# Patient Record
Sex: Male | Born: 1960 | Race: White | Hispanic: No | Marital: Single | Smoking: Never smoker
Health system: Southern US, Community
[De-identification: ages and names within clinical notes are randomized; demographics above are authoritative.]

## PROBLEM LIST (undated history)

## (undated) DIAGNOSIS — F068 Other specified mental disorders due to known physiological condition: Secondary | ICD-10-CM

## (undated) DIAGNOSIS — I10 Essential (primary) hypertension: Secondary | ICD-10-CM

## (undated) DIAGNOSIS — Z9889 Other specified postprocedural states: Secondary | ICD-10-CM

## (undated) DIAGNOSIS — M720 Palmar fascial fibromatosis [Dupuytren]: Secondary | ICD-10-CM

## (undated) DIAGNOSIS — Z87828 Personal history of other (healed) physical injury and trauma: Secondary | ICD-10-CM

## (undated) DIAGNOSIS — G40909 Epilepsy, unspecified, not intractable, without status epilepticus: Secondary | ICD-10-CM

## (undated) DIAGNOSIS — E785 Hyperlipidemia, unspecified: Secondary | ICD-10-CM

## (undated) DIAGNOSIS — R569 Unspecified convulsions: Secondary | ICD-10-CM

## (undated) DIAGNOSIS — M2550 Pain in unspecified joint: Secondary | ICD-10-CM

## (undated) DIAGNOSIS — M170 Bilateral primary osteoarthritis of knee: Secondary | ICD-10-CM

## (undated) DIAGNOSIS — R251 Tremor, unspecified: Secondary | ICD-10-CM

## (undated) HISTORY — PX: RIB FRACTURE SURGERY: SHX2358

## (undated) HISTORY — DX: Unspecified convulsions: R56.9

## (undated) HISTORY — DX: Pain in unspecified joint: M25.50

## (undated) HISTORY — DX: Hyperlipidemia, unspecified: E78.5

## (undated) HISTORY — PX: BRAIN SURGERY: SHX531

## (undated) HISTORY — DX: Essential (primary) hypertension: I10

---

## 2014-11-26 ENCOUNTER — Ambulatory Visit (INDEPENDENT_AMBULATORY_CARE_PROVIDER_SITE_OTHER): Payer: Medicare PPO | Admitting: Family Medicine

## 2014-11-26 ENCOUNTER — Encounter: Payer: Self-pay | Admitting: Family Medicine

## 2014-11-26 VITALS — BP 118/80 | HR 64 | Ht 71.0 in | Wt 200.0 lb

## 2014-11-26 DIAGNOSIS — I10 Essential (primary) hypertension: Secondary | ICD-10-CM | POA: Diagnosis not present

## 2014-11-26 DIAGNOSIS — G40909 Epilepsy, unspecified, not intractable, without status epilepticus: Secondary | ICD-10-CM

## 2014-11-26 DIAGNOSIS — E785 Hyperlipidemia, unspecified: Secondary | ICD-10-CM | POA: Diagnosis not present

## 2014-11-26 DIAGNOSIS — M170 Bilateral primary osteoarthritis of knee: Secondary | ICD-10-CM | POA: Diagnosis not present

## 2014-11-26 LAB — POCT URINALYSIS DIPSTICK
BILIRUBIN UA: NEGATIVE
Blood, UA: NEGATIVE
Glucose, UA: NEGATIVE
KETONES UA: NEGATIVE
LEUKOCYTES UA: NEGATIVE
Nitrite, UA: NEGATIVE
Protein, UA: NEGATIVE
Spec Grav, UA: 1.02
Urobilinogen, UA: 0.2
pH, UA: 6

## 2014-11-26 MED ORDER — DIVALPROEX SODIUM 500 MG PO DR TAB: 500.0000 mg | DELAYED_RELEASE_TABLET | Freq: Three times a day (TID) | ORAL | Status: DC

## 2014-11-26 MED ORDER — ETODOLAC 500 MG PO TABS: 500.0000 mg | ORAL_TABLET | Freq: Two times a day (BID) | ORAL | Status: DC

## 2014-11-26 MED ORDER — LISINOPRIL-HYDROCHLOROTHIAZIDE 10-12.5 MG PO TABS: 1.0000 | ORAL_TABLET | Freq: Every day | ORAL | Status: DC

## 2014-11-26 MED ORDER — CARBAMAZEPINE 200 MG PO TABS: 400.0000 mg | ORAL_TABLET | Freq: Three times a day (TID) | ORAL | Status: DC

## 2014-11-26 MED ORDER — LOVASTATIN ER 40 MG PO TB24: 40.0000 mg | ORAL_TABLET | Freq: Every day | ORAL | Status: DC

## 2014-11-26 NOTE — Progress Notes (Signed)
Name: Jerome Gilmore   MRN: [id number]    DOB: 1961/03/09   Date:11/26/2014       Progress Note  Subjective  Chief Complaint  Chief Complaint  Patient presents with  . Hypertension  . Hyperlipidemia  . Seizures    Hypertension This is a recurrent problem. The current episode started more than 1 year ago. The problem has been gradually improving since onset. Pertinent negatives include no anxiety, blurred vision, chest pain, headaches, malaise/fatigue, neck pain, palpitations, PND or shortness of breath. Agents associated with hypertension include NSAIDs. Risk factors for coronary artery disease include dyslipidemia, obesity and male gender. Past treatments include ACE inhibitors and diuretics. The current treatment provides moderate improvement. There are no compliance problems.  There is no history of angina, kidney disease, CAD/MI, CVA, heart failure, left ventricular hypertrophy, PVD or retinopathy. There is no history of chronic renal disease.  Hyperlipidemia This is a recurrent problem. The current episode started more than 1 month ago. The problem is controlled. Recent lipid tests were reviewed and are normal. He has no history of chronic renal disease. Factors aggravating his hyperlipidemia include thiazides. Pertinent negatives include no chest pain, focal weakness, myalgias or shortness of breath. Current antihyperlipidemic treatment includes statins. The current treatment provides moderate improvement of lipids. There are no compliance problems.  Risk factors for coronary artery disease include dyslipidemia, a sedentary lifestyle, male sex, obesity and hypertension.  Seizures  This is a chronic problem. The problem has been gradually improving. Pertinent negatives include no headaches, no sore throat, no chest pain, no cough, no nausea and no diarrhea. The episode was not witnessed. There was no sensation of an aura present. There has been no fever.  Back Pain This is a chronic (knees and  back) problem. The current episode started more than 1 year ago. The quality of the pain is described as aching. The pain is moderate. Pertinent negatives include no abdominal pain, chest pain, dysuria, fever, headaches, tingling or weight loss.    No problem-specific assessment & plan notes found for this encounter.   Past Medical History  Diagnosis Date  . Seizures   . Hyperlipidemia   . Joint pain   . Hypertension     Past Surgical History  Procedure Laterality Date  . Rib fracture surgery      History reviewed. No pertinent family history.  History   Social History  . Marital Status: Unknown    Spouse Name: N/A  . Number of Children: N/A  . Years of Education: N/A   Occupational History  . Not on file.   Social History Main Topics  . Smoking status: Never Smoker   . Smokeless tobacco: Not on file  . Alcohol Use: No  . Drug Use: No  . Sexual Activity: Not Currently   Other Topics Concern  . Not on file   Social History Narrative  . No narrative on file    No Known Allergies   Review of Systems  Constitutional: Negative for fever, chills, weight loss and malaise/fatigue.  HENT: Negative for ear discharge, ear pain and sore throat.   Eyes: Negative for blurred vision.  Respiratory: Negative for cough, sputum production, shortness of breath and wheezing.   Cardiovascular: Negative for chest pain, palpitations, leg swelling and PND.  Gastrointestinal: Negative for heartburn, nausea, abdominal pain, diarrhea, constipation, blood in stool and melena.  Genitourinary: Negative for dysuria, urgency, frequency and hematuria.  Musculoskeletal: Positive for back pain. Negative for myalgias, joint pain and neck  pain.  Skin: Negative for rash.  Neurological: Positive for seizures. Negative for dizziness, tingling, sensory change, focal weakness and headaches.  Endo/Heme/Allergies: Negative for environmental allergies and polydipsia. Does not bruise/bleed easily.   Psychiatric/Behavioral: Negative for depression and suicidal ideas. The patient is not nervous/anxious and does not have insomnia.      Objective  Filed Vitals:   11/26/14 0905  BP: 118/80  Pulse: 64  Height:  (1.803 m)  Weight: 200 lb (90.719 kg)    Physical Exam  Constitutional: He is oriented to person, place, and time and well-developed, well-nourished, and in no distress.  HENT:  Head: Normocephalic.  Right Ear: External ear normal.  Left Ear: External ear normal.  Nose: Nose normal.  Mouth/Throat: Oropharynx is clear and moist.  Eyes: Conjunctivae and EOM are normal. Pupils are equal, round, and reactive to light. Right eye exhibits no discharge. Left eye exhibits no discharge. No scleral icterus.  Neck: Normal range of motion. Neck supple. No JVD present. No tracheal deviation present. No thyromegaly present.  Cardiovascular: Normal rate, regular rhythm, normal heart sounds and intact distal pulses.  Exam reveals no gallop and no friction rub.   No murmur heard. Pulmonary/Chest: Breath sounds normal. No respiratory distress. He has no wheezes. He has no rales.  Abdominal: Soft. Bowel sounds are normal. He exhibits no mass. There is no hepatosplenomegaly. There is no tenderness. There is no rebound, no guarding and no CVA tenderness.  Musculoskeletal: Normal range of motion. He exhibits no edema or tenderness.  Lymphadenopathy:    He has no cervical adenopathy.  Neurological: He is alert and oriented to person, place, and time. He has normal sensation, normal strength, normal reflexes and intact cranial nerves. No cranial nerve deficit. He exhibits normal muscle tone. Gait normal.  Skin: Skin is warm. No rash noted.  Psychiatric: Mood and affect normal.  Nursing note and vitals reviewed.     No results found for this or any previous visit (from the past 2160 hour(s)).   Assessment & Plan  Problem List Items Addressed This Visit      Cardiovascular and  Mediastinum   Hypertension - Primary   Relevant Medications   lisinopril-hydrochlorothiazide (PRINZIDE,ZESTORETIC) 10-12.5 MG per tablet   lovastatin (ALTOPREV) 40 MG 24 hr tablet   Other Relevant Orders   Renal Function Panel    Other Visit Diagnoses    Hyperlipemia        Relevant Medications    lisinopril-hydrochlorothiazide (PRINZIDE,ZESTORETIC) 10-12.5 MG per tablet    lovastatin (ALTOPREV) 40 MG 24 hr tablet    Other Relevant Orders    Lipid Profile    Seizure disorder        Relevant Medications    carbamazepine (TEGRETOL) 200 MG tablet    divalproex (DEPAKOTE) 500 MG DR tablet    Other Relevant Orders    Hepatic function panel    Primary osteoarthritis of both knees        Relevant Medications    etodolac (LODINE) 500 MG tablet    Other Relevant Orders    Renal Function Panel         Dr. Hayden [facility]  11/26/2014

## 2014-11-27 LAB — HEPATIC FUNCTION PANEL
ALK PHOS: 44 IU/L (ref 39–117)
ALT: 14 IU/L (ref 0–44)
AST: 19 IU/L (ref 0–40)
BILIRUBIN, DIRECT: 0.13 mg/dL (ref 0.00–0.40)
Bilirubin Total: 0.4 mg/dL (ref 0.0–1.2)
Total Protein: 6.6 g/dL (ref 6.0–8.5)

## 2014-11-27 LAB — LIPID PANEL
Chol/HDL Ratio: 3.4 ratio units (ref 0.0–5.0)
Cholesterol, Total: 253 mg/dL — ABNORMAL HIGH (ref 100–199)
HDL: 74 mg/dL (ref >39)
LDL CALC: 138 mg/dL — AB (ref 0–99)
Triglycerides: 203 mg/dL — ABNORMAL HIGH (ref 0–149)
VLDL CHOLESTEROL CAL: 41 mg/dL — AB (ref 5–40)

## 2014-11-27 LAB — RENAL FUNCTION PANEL
ALBUMIN: 4.6 g/dL (ref 3.5–5.5)
BUN/Creatinine Ratio: 25 — ABNORMAL HIGH (ref 9–20)
BUN: 23 mg/dL (ref 6–24)
CHLORIDE: 96 mmol/L — AB (ref 97–108)
CO2: 27 mmol/L (ref 18–29)
Calcium: 8.8 mg/dL (ref 8.7–10.2)
Creatinine, Ser: 0.93 mg/dL (ref 0.76–1.27)
GFR calc non Af Amer: 94 mL/min/{1.73_m2} (ref >59)
GFR, EST AFRICAN AMERICAN: 109 mL/min/{1.73_m2} (ref >59)
Glucose: 93 mg/dL (ref 65–99)
POTASSIUM: 4.7 mmol/L (ref 3.5–5.2)
Phosphorus: 2.5 mg/dL (ref 2.5–4.5)
Sodium: 139 mmol/L (ref 134–144)

## 2014-12-03 ENCOUNTER — Other Ambulatory Visit: Payer: Self-pay

## 2014-12-03 DIAGNOSIS — R569 Unspecified convulsions: Secondary | ICD-10-CM | POA: Insufficient documentation

## 2014-12-03 DIAGNOSIS — E785 Hyperlipidemia, unspecified: Secondary | ICD-10-CM

## 2014-12-03 DIAGNOSIS — G40909 Epilepsy, unspecified, not intractable, without status epilepticus: Secondary | ICD-10-CM

## 2014-12-03 MED ORDER — CARBAMAZEPINE 200 MG PO TABS: 400.0000 mg | ORAL_TABLET | Freq: Three times a day (TID) | ORAL | Status: DC

## 2014-12-03 MED ORDER — LOVASTATIN 40 MG PO TABS: 40.0000 mg | ORAL_TABLET | Freq: Every day | ORAL | Status: DC

## 2014-12-19 DIAGNOSIS — E785 Hyperlipidemia, unspecified: Secondary | ICD-10-CM | POA: Diagnosis not present

## 2014-12-19 DIAGNOSIS — E663 Overweight: Secondary | ICD-10-CM | POA: Diagnosis not present

## 2014-12-19 DIAGNOSIS — K5900 Constipation, unspecified: Secondary | ICD-10-CM | POA: Diagnosis not present

## 2014-12-19 DIAGNOSIS — Z6830 Body mass index (BMI) 30.0-30.9, adult: Secondary | ICD-10-CM | POA: Diagnosis not present

## 2014-12-19 DIAGNOSIS — Z87891 Personal history of nicotine dependence: Secondary | ICD-10-CM | POA: Diagnosis not present

## 2014-12-19 DIAGNOSIS — M1711 Unilateral primary osteoarthritis, right knee: Secondary | ICD-10-CM | POA: Diagnosis not present

## 2014-12-19 DIAGNOSIS — I10 Essential (primary) hypertension: Secondary | ICD-10-CM | POA: Diagnosis not present

## 2014-12-19 DIAGNOSIS — G40909 Epilepsy, unspecified, not intractable, without status epilepticus: Secondary | ICD-10-CM | POA: Diagnosis not present

## 2015-05-21 ENCOUNTER — Encounter: Payer: Self-pay | Admitting: Family Medicine

## 2015-05-21 ENCOUNTER — Ambulatory Visit (INDEPENDENT_AMBULATORY_CARE_PROVIDER_SITE_OTHER): Payer: Medicare PPO | Admitting: Family Medicine

## 2015-05-21 VITALS — BP 120/80 | HR 64 | Ht 72.0 in | Wt 218.0 lb

## 2015-05-21 DIAGNOSIS — I10 Essential (primary) hypertension: Secondary | ICD-10-CM | POA: Diagnosis not present

## 2015-05-21 DIAGNOSIS — M170 Bilateral primary osteoarthritis of knee: Secondary | ICD-10-CM

## 2015-05-21 DIAGNOSIS — E785 Hyperlipidemia, unspecified: Secondary | ICD-10-CM

## 2015-05-21 DIAGNOSIS — R69 Illness, unspecified: Secondary | ICD-10-CM | POA: Diagnosis not present

## 2015-05-21 DIAGNOSIS — G40909 Epilepsy, unspecified, not intractable, without status epilepticus: Secondary | ICD-10-CM

## 2015-05-21 MED ORDER — CARBAMAZEPINE 200 MG PO TABS: 400.0000 mg | ORAL_TABLET | Freq: Three times a day (TID) | ORAL | Status: DC

## 2015-05-21 MED ORDER — DIVALPROEX SODIUM 500 MG PO DR TAB: 500.0000 mg | DELAYED_RELEASE_TABLET | Freq: Three times a day (TID) | ORAL | Status: DC

## 2015-05-21 MED ORDER — LISINOPRIL-HYDROCHLOROTHIAZIDE 10-12.5 MG PO TABS: 1.0000 | ORAL_TABLET | Freq: Every day | ORAL | Status: DC

## 2015-05-21 MED ORDER — ETODOLAC 500 MG PO TABS: 500.0000 mg | ORAL_TABLET | Freq: Two times a day (BID) | ORAL | Status: DC

## 2015-05-21 NOTE — Progress Notes (Signed)
Name: Jerome Gilmore   MRN: [id number]    DOB: 03/25/1961   Date:05/21/2015       Progress Note  Subjective  Chief Complaint  Chief Complaint  Patient presents with  . Hyperlipidemia  . Hypertension  . Seizures  . Arthritis    Hyperlipidemia This is a chronic problem. The current episode started more than 1 year ago. The problem is controlled. Recent lipid tests were reviewed and are normal. He has no history of chronic renal disease, diabetes, hypothyroidism, liver disease, obesity or nephrotic syndrome. Factors aggravating his hyperlipidemia include thiazides. Pertinent negatives include no chest pain, focal sensory loss, focal weakness, leg pain, myalgias or shortness of breath. Current antihyperlipidemic treatment includes statins. The current treatment provides mild improvement of lipids. There are no compliance problems.  Risk factors for coronary artery disease include dyslipidemia, hypertension, male sex and obesity.  Hypertension This is a chronic problem. The current episode started more than 1 year ago. The problem has been waxing and waning since onset. The problem is controlled. Pertinent negatives include no anxiety, blurred vision, chest pain, headaches, malaise/fatigue, neck pain, palpitations, peripheral edema, PND or shortness of breath. There are no associated agents to hypertension. Risk factors for coronary artery disease include dyslipidemia, male gender and obesity. Past treatments include diuretics and ACE inhibitors. The current treatment provides moderate improvement. There are no compliance problems.  There is no history of angina, kidney disease, CAD/MI, CVA, heart failure, left ventricular hypertrophy, PVD, renovascular disease or retinopathy. There is no history of chronic renal disease or a hypertension causing med.  Seizures  This is a chronic problem. Number of times: none controlled. Pertinent negatives include no sleepiness, no confusion, no headaches, no sore  throat, no chest pain, no cough, no nausea and no diarrhea.  Arthritis Presents for follow-up visit. The disease course has been stable. He reports no stiffness, joint swelling or joint warmth. Pertinent negatives include no diarrhea, dry eyes, dry mouth, dysuria, fatigue, fever, pain at night, pain while resting, rash, Raynaud's syndrome, uveitis or weight loss. Past treatments include NSAIDs. The treatment provided significant relief.    No problem-specific assessment & plan notes found for this encounter.   Past Medical History  Diagnosis Date  . Seizures (HCC)   . Hyperlipidemia   . Joint pain   . Hypertension     Past Surgical History  Procedure Laterality Date  . Rib fracture surgery      History reviewed. No pertinent family history.  Social History   Social History  . Marital Status: Unknown    Spouse Name: N/A  . Number of Children: N/A  . Years of Education: N/A   Occupational History  . Not on file.   Social History Main Topics  . Smoking status: Never Smoker   . Smokeless tobacco: Not on file  . Alcohol Use: No  . Drug Use: No  . Sexual Activity: Not Currently   Other Topics Concern  . Not on file   Social History Narrative    No Known Allergies   Review of Systems  Constitutional: Negative for fever, chills, weight loss, malaise/fatigue and fatigue.  HENT: Negative for ear discharge, ear pain and sore throat.   Eyes: Negative for blurred vision.  Respiratory: Negative for cough, sputum production, shortness of breath and wheezing.   Cardiovascular: Negative for chest pain, palpitations, leg swelling and PND.  Gastrointestinal: Negative for heartburn, nausea, abdominal pain, diarrhea, constipation, blood in stool and melena.  Genitourinary: Negative for dysuria,  urgency, frequency and hematuria.  Musculoskeletal: Positive for arthritis. Negative for myalgias, back pain, joint pain, joint swelling, stiffness and neck pain.  Skin: Negative for  rash.  Neurological: Positive for seizures. Negative for dizziness, tingling, sensory change, focal weakness and headaches.  Endo/Heme/Allergies: Negative for environmental allergies and polydipsia. Does not bruise/bleed easily.  Psychiatric/Behavioral: Negative for depression, suicidal ideas and confusion. The patient is not nervous/anxious and does not have insomnia.      Objective  Filed Vitals:   05/21/15 0838  BP: 120/80  Pulse: 64  Height: 6' (1.829 m)  Weight: 218 lb (98.884 kg)    Physical Exam  Constitutional: He is oriented to person, place, and time and well-developed, well-nourished, and in no distress.  HENT:  Head: Normocephalic.  Right Ear: External ear normal.  Left Ear: External ear normal.  Nose: Nose normal.  Mouth/Throat: Oropharynx is clear and moist.  Eyes: Conjunctivae and EOM are normal. Pupils are equal, round, and reactive to light. Right eye exhibits no discharge. Left eye exhibits no discharge. No scleral icterus.  Neck: Normal range of motion. Neck supple. No JVD present. No tracheal deviation present. No thyromegaly present.  Cardiovascular: Normal rate, regular rhythm, normal heart sounds and intact distal pulses.  Exam reveals no gallop and no friction rub.   No murmur heard. Pulmonary/Chest: Breath sounds normal. No respiratory distress. He has no wheezes. He has no rales.  Abdominal: Soft. Bowel sounds are normal. He exhibits no mass. There is no hepatosplenomegaly. There is no tenderness. There is no rebound, no guarding and no CVA tenderness.  Musculoskeletal: Normal range of motion. He exhibits no edema or tenderness.  Lymphadenopathy:    He has no cervical adenopathy.  Neurological: He is alert and oriented to person, place, and time. He has normal sensation, normal strength, normal reflexes and intact cranial nerves. No cranial nerve deficit.  Skin: Skin is warm. No rash noted.  Psychiatric: Mood and affect normal.      Assessment &  Plan  Problem List Items Addressed This Visit      Cardiovascular and Mediastinum   Hypertension - Primary   Relevant Medications   lisinopril-hydrochlorothiazide (PRINZIDE,ZESTORETIC) 10-12.5 MG tablet   Other Relevant Orders   Renal Function Panel    Other Visit Diagnoses    Primary osteoarthritis of both knees        Relevant Medications    etodolac (LODINE) 500 MG tablet    Seizure disorder (HCC)        Relevant Medications    divalproex (DEPAKOTE) 500 MG DR tablet    carbamazepine (TEGRETOL) 200 MG tablet    Other Relevant Orders    Valproic Acid level    Hyperlipidemia        Relevant Medications    lisinopril-hydrochlorothiazide (PRINZIDE,ZESTORETIC) 10-12.5 MG tablet    Taking medication for chronic disease        Relevant Orders    Hepatic function panel         Dr. [facility]  05/21/2015

## 2015-05-22 LAB — HEPATIC FUNCTION PANEL
ALT: 10 IU/L (ref 0–44)
AST: 15 IU/L (ref 0–40)
Alkaline Phosphatase: 41 IU/L (ref 39–117)
BILIRUBIN, DIRECT: 0.13 mg/dL (ref 0.00–0.40)
Bilirubin Total: 0.4 mg/dL (ref 0.0–1.2)
Total Protein: 6.1 g/dL (ref 6.0–8.5)

## 2015-05-22 LAB — RENAL FUNCTION PANEL
Albumin: 4.3 g/dL (ref 3.5–5.5)
BUN/Creatinine Ratio: 18 (ref 9–20)
BUN: 17 mg/dL (ref 6–24)
CO2: 28 mmol/L (ref 18–29)
Calcium: 9.1 mg/dL (ref 8.7–10.2)
Chloride: 101 mmol/L (ref 97–106)
Creatinine, Ser: 0.92 mg/dL (ref 0.76–1.27)
GFR calc Af Amer: 110 mL/min/{1.73_m2} (ref >59)
GFR calc non Af Amer: 95 mL/min/{1.73_m2} (ref >59)
GLUCOSE: 95 mg/dL (ref 65–99)
POTASSIUM: 4.8 mmol/L (ref 3.5–5.2)
Phosphorus: 2.8 mg/dL (ref 2.5–4.5)
SODIUM: 144 mmol/L (ref 136–144)

## 2015-05-22 LAB — VALPROIC ACID LEVEL: VALPROIC ACID LVL: 66 ug/mL (ref 50–100)

## 2015-06-08 ENCOUNTER — Other Ambulatory Visit: Payer: Self-pay

## 2015-06-08 DIAGNOSIS — E785 Hyperlipidemia, unspecified: Secondary | ICD-10-CM

## 2015-06-08 MED ORDER — LOVASTATIN 40 MG PO TABS: 40.0000 mg | ORAL_TABLET | Freq: Every day | ORAL | Status: DC

## 2015-11-25 ENCOUNTER — Encounter: Payer: Self-pay | Admitting: Family Medicine

## 2015-11-25 ENCOUNTER — Ambulatory Visit (INDEPENDENT_AMBULATORY_CARE_PROVIDER_SITE_OTHER): Payer: Medicare PPO | Admitting: Family Medicine

## 2015-11-25 VITALS — BP 118/70 | HR 70 | Ht 72.0 in | Wt 212.0 lb

## 2015-11-25 DIAGNOSIS — I10 Essential (primary) hypertension: Secondary | ICD-10-CM

## 2015-11-25 DIAGNOSIS — M1711 Unilateral primary osteoarthritis, right knee: Secondary | ICD-10-CM

## 2015-11-25 DIAGNOSIS — E785 Hyperlipidemia, unspecified: Secondary | ICD-10-CM

## 2015-11-25 DIAGNOSIS — M170 Bilateral primary osteoarthritis of knee: Secondary | ICD-10-CM

## 2015-11-25 DIAGNOSIS — R69 Illness, unspecified: Secondary | ICD-10-CM | POA: Diagnosis not present

## 2015-11-25 DIAGNOSIS — G40909 Epilepsy, unspecified, not intractable, without status epilepticus: Secondary | ICD-10-CM | POA: Insufficient documentation

## 2015-11-25 HISTORY — DX: Epilepsy, unspecified, not intractable, without status epilepticus: G40.909

## 2015-11-25 MED ORDER — CARBAMAZEPINE 200 MG PO TABS: 400.0000 mg | ORAL_TABLET | Freq: Three times a day (TID) | ORAL | Status: DC

## 2015-11-25 MED ORDER — DIVALPROEX SODIUM 500 MG PO DR TAB: 500.0000 mg | DELAYED_RELEASE_TABLET | Freq: Three times a day (TID) | ORAL | Status: DC

## 2015-11-25 MED ORDER — LOVASTATIN 40 MG PO TABS: 40.0000 mg | ORAL_TABLET | Freq: Every day | ORAL | Status: DC

## 2015-11-25 MED ORDER — LISINOPRIL-HYDROCHLOROTHIAZIDE 10-12.5 MG PO TABS: 1.0000 | ORAL_TABLET | Freq: Every day | ORAL | Status: DC

## 2015-11-25 MED ORDER — ETODOLAC 500 MG PO TABS: 500.0000 mg | ORAL_TABLET | Freq: Two times a day (BID) | ORAL | Status: DC

## 2015-11-25 NOTE — Progress Notes (Signed)
Name: Jerome Gilmore   MRN: [id number]    DOB: September 22, 1960   Date:11/25/2015       Progress Note  Subjective  Chief Complaint  Chief Complaint  Patient presents with  . Hypertension  . Hyperlipidemia  . Seizures  . Arthritis    Hypertension This is a chronic problem. The current episode started more than 1 year ago. The problem has been gradually improving since onset. The problem is controlled. Pertinent negatives include no blurred vision, chest pain, headaches, malaise/fatigue, neck pain, palpitations or shortness of breath. There are no associated agents to hypertension. Risk factors for coronary artery disease include dyslipidemia and obesity. Past treatments include ACE inhibitors and diuretics. The current treatment provides moderate improvement. There are no compliance problems.  There is no history of angina, kidney disease, CAD/MI, CVA, heart failure, left ventricular hypertrophy, PVD, renovascular disease or retinopathy. There is no history of chronic renal disease or a hypertension causing med.  Hyperlipidemia This is a chronic problem. The current episode started more than 1 year ago. The problem is uncontrolled. He has no history of chronic renal disease, diabetes, hypothyroidism, liver disease, obesity or nephrotic syndrome. Pertinent negatives include no chest pain, focal weakness, myalgias or shortness of breath. Current antihyperlipidemic treatment includes statins. The current treatment provides moderate improvement of lipids. There are no compliance problems.   Seizures  This is a chronic problem. The problem has been gradually improving. Pertinent negatives include no headaches, no sore throat, no chest pain, no cough, no nausea and no diarrhea.  Arthritis Presents for follow-up visit. The disease course has been improving. He complains of pain. He reports no stiffness, joint swelling or joint warmth. Affected locations include the right knee. His pain is at a severity of 5/10.  Pertinent negatives include no diarrhea, dysuria, fever, rash or weight loss. His past medical history is significant for osteoarthritis.    No problem-specific assessment & plan notes found for this encounter.   Past Medical History  Diagnosis Date  . Seizures (HCC)   . Hyperlipidemia   . Joint pain   . Hypertension     Past Surgical History  Procedure Laterality Date  . Rib fracture surgery      History reviewed. No pertinent family history.  Social History   Social History  . Marital Status: Unknown    Spouse Name: N/A  . Number of Children: N/A  . Years of Education: N/A   Occupational History  . Not on file.   Social History Main Topics  . Smoking status: Never Smoker   . Smokeless tobacco: Not on file  . Alcohol Use: No  . Drug Use: No  . Sexual Activity: Not Currently   Other Topics Concern  . Not on file   Social History Narrative    No Known Allergies   Review of Systems  Constitutional: Negative for fever, chills, weight loss and malaise/fatigue.  HENT: Negative for ear discharge, ear pain and sore throat.   Eyes: Negative for blurred vision.  Respiratory: Negative for cough, sputum production, shortness of breath and wheezing.   Cardiovascular: Negative for chest pain, palpitations and leg swelling.  Gastrointestinal: Negative for heartburn, nausea, abdominal pain, diarrhea, constipation, blood in stool and melena.  Genitourinary: Negative for dysuria, urgency, frequency and hematuria.  Musculoskeletal: Positive for arthritis. Negative for myalgias, back pain, joint pain, joint swelling, stiffness and neck pain.  Skin: Negative for rash.  Neurological: Positive for seizures. Negative for dizziness, tingling, sensory change, focal weakness and  headaches.  Endo/Heme/Allergies: Negative for environmental allergies and polydipsia. Does not bruise/bleed easily.  Psychiatric/Behavioral: Negative for depression and suicidal ideas. The patient is not  nervous/anxious and does not have insomnia.      Objective  Filed Vitals:   11/25/15 1051  BP: 118/70  Pulse: 70  Height: 6' (1.829 m)  Weight: 212 lb (96.163 kg)    Physical Exam  Constitutional: He is oriented to person, place, and time and well-developed, well-nourished, and in no distress.  HENT:  Head: Normocephalic.  Right Ear: External ear normal.  Left Ear: External ear normal.  Nose: Nose normal.  Mouth/Throat: Oropharynx is clear and moist.  Eyes: Conjunctivae and EOM are normal. Pupils are equal, round, and reactive to light. Right eye exhibits no discharge. Left eye exhibits no discharge. No scleral icterus.  Neck: Normal range of motion. Neck supple. No JVD present. No tracheal deviation present. No thyromegaly present.  Cardiovascular: Normal rate, regular rhythm, normal heart sounds and intact distal pulses.  Exam reveals no gallop and no friction rub.   No murmur heard. Pulmonary/Chest: Breath sounds normal. No respiratory distress. He has no wheezes. He has no rales.  Abdominal: Soft. Bowel sounds are normal. He exhibits no mass. There is no hepatosplenomegaly. There is no tenderness. There is no rebound, no guarding and no CVA tenderness.  Musculoskeletal: Normal range of motion. He exhibits no edema or tenderness.  Lymphadenopathy:    He has no cervical adenopathy.  Neurological: He is alert and oriented to person, place, and time. He has normal sensation, normal strength, normal reflexes and intact cranial nerves. No cranial nerve deficit.  Skin: Skin is warm. No rash noted.  Psychiatric: Mood and affect normal.  Nursing note and vitals reviewed.     Assessment & Plan  Problem List Items Addressed This Visit      Cardiovascular and Mediastinum   Hypertension - Primary   Relevant Medications   lisinopril-hydrochlorothiazide (PRINZIDE,ZESTORETIC) 10-12.5 MG tablet   lovastatin (MEVACOR) 40 MG tablet   Other Relevant Orders   Renal Function Panel      Nervous and Auditory   Seizure disorder (HCC)   Relevant Medications   carbamazepine (TEGRETOL) 200 MG tablet   divalproex (DEPAKOTE) 500 MG DR tablet   Other Relevant Orders   Carbamazepine Level (Tegretol), total   Valproic Acid level     Musculoskeletal and Integument   Primary osteoarthritis of right knee   Relevant Medications   etodolac (LODINE) 500 MG tablet     Other   Hyperlipidemia   Relevant Medications   lisinopril-hydrochlorothiazide (PRINZIDE,ZESTORETIC) 10-12.5 MG tablet   lovastatin (MEVACOR) 40 MG tablet   Other Relevant Orders   Lipid Profile    Other Visit Diagnoses    Primary osteoarthritis of both knees        Relevant Medications    etodolac (LODINE) 500 MG tablet    Taking medication for chronic disease        Relevant Orders    Hepatic function panel         Dr. [facility]den [facility]  11/25/2015

## 2015-11-26 LAB — HEPATIC FUNCTION PANEL
ALT: 9 IU/L (ref 0–44)
AST: 15 IU/L (ref 0–40)
Alkaline Phosphatase: 45 IU/L (ref 39–117)
BILIRUBIN TOTAL: 0.4 mg/dL (ref 0.0–1.2)
Bilirubin, Direct: 0.13 mg/dL (ref 0.00–0.40)
Total Protein: 6.5 g/dL (ref 6.0–8.5)

## 2015-11-26 LAB — RENAL FUNCTION PANEL
ALBUMIN: 4.3 g/dL (ref 3.5–5.5)
BUN / CREAT RATIO: 21 — AB (ref 9–20)
BUN: 22 mg/dL (ref 6–24)
CALCIUM: 9.3 mg/dL (ref 8.7–10.2)
CHLORIDE: 99 mmol/L (ref 96–106)
CO2: 28 mmol/L (ref 18–29)
CREATININE: 1.03 mg/dL (ref 0.76–1.27)
GFR calc Af Amer: 95 mL/min/{1.73_m2} (ref >59)
GFR calc non Af Amer: 83 mL/min/{1.73_m2} (ref >59)
Glucose: 93 mg/dL (ref 65–99)
Phosphorus: 3 mg/dL (ref 2.5–4.5)
Potassium: 5 mmol/L (ref 3.5–5.2)
Sodium: 143 mmol/L (ref 134–144)

## 2015-11-26 LAB — VALPROIC ACID LEVEL: VALPROIC ACID LVL: 89 ug/mL (ref 50–100)

## 2015-11-26 LAB — LIPID PANEL
CHOLESTEROL TOTAL: 221 mg/dL — AB (ref 100–199)
Chol/HDL Ratio: 2.8 ratio units (ref 0.0–5.0)
HDL: 80 mg/dL (ref >39)
LDL CALC: 113 mg/dL — AB (ref 0–99)
Triglycerides: 139 mg/dL (ref 0–149)
VLDL CHOLESTEROL CAL: 28 mg/dL (ref 5–40)

## 2015-11-26 LAB — CARBAMAZEPINE LEVEL, TOTAL: CARBAMAZEPINE LVL: 9.5 ug/mL (ref 4.0–12.0)

## 2016-05-24 ENCOUNTER — Ambulatory Visit (INDEPENDENT_AMBULATORY_CARE_PROVIDER_SITE_OTHER): Payer: Medicare PPO | Admitting: Family Medicine

## 2016-05-24 ENCOUNTER — Encounter: Payer: Self-pay | Admitting: Family Medicine

## 2016-05-24 VITALS — BP 120/80 | HR 60 | Ht 72.0 in | Wt 213.0 lb

## 2016-05-24 DIAGNOSIS — Z1159 Encounter for screening for other viral diseases: Secondary | ICD-10-CM | POA: Diagnosis not present

## 2016-05-24 DIAGNOSIS — I10 Essential (primary) hypertension: Secondary | ICD-10-CM

## 2016-05-24 DIAGNOSIS — M170 Bilateral primary osteoarthritis of knee: Secondary | ICD-10-CM | POA: Diagnosis not present

## 2016-05-24 DIAGNOSIS — G40909 Epilepsy, unspecified, not intractable, without status epilepticus: Secondary | ICD-10-CM | POA: Diagnosis not present

## 2016-05-24 DIAGNOSIS — Z23 Encounter for immunization: Secondary | ICD-10-CM | POA: Diagnosis not present

## 2016-05-24 DIAGNOSIS — E784 Other hyperlipidemia: Secondary | ICD-10-CM

## 2016-05-24 DIAGNOSIS — E7849 Other hyperlipidemia: Secondary | ICD-10-CM

## 2016-05-24 MED ORDER — DIVALPROEX SODIUM 500 MG PO DR TAB: 500.0000 mg | DELAYED_RELEASE_TABLET | Freq: Three times a day (TID) | ORAL | 1 refills | Status: DC

## 2016-05-24 MED ORDER — ETODOLAC 500 MG PO TABS: 500.0000 mg | ORAL_TABLET | Freq: Two times a day (BID) | ORAL | 1 refills | Status: DC

## 2016-05-24 MED ORDER — CARBAMAZEPINE 200 MG PO TABS: 400.0000 mg | ORAL_TABLET | Freq: Three times a day (TID) | ORAL | 1 refills | Status: DC

## 2016-05-24 MED ORDER — LOVASTATIN 40 MG PO TABS
40.0000 mg | ORAL_TABLET | Freq: Every day | ORAL | 1 refills | Status: DC
Start: 2016-05-24 — End: 2016-11-24

## 2016-05-24 MED ORDER — LISINOPRIL-HYDROCHLOROTHIAZIDE 10-12.5 MG PO TABS: 1.0000 | ORAL_TABLET | Freq: Every day | ORAL | 1 refills | Status: DC

## 2016-05-24 NOTE — Progress Notes (Signed)
Name: Lucile CraterBilly Slavick   MRN: [id number]    DOB: 06/07/1961   Date:05/24/2016       Progress Note  Subjective  Chief Complaint  Chief Complaint  Patient presents with  . Seizures  . Hypertension  . Hyperlipidemia  . Arthritis    takes Etodolac for joint pain    Seizures   This is a chronic problem. The problem has been gradually improving. Pertinent negatives include no sleepiness, no confusion, no headaches, no speech difficulty, no visual disturbance, no neck stiffness, no sore throat, no chest pain, no cough, no nausea, no vomiting, no diarrhea and no muscle weakness. Characteristics do not include eye blinking, eye deviation, bowel incontinence, bladder incontinence, rhythmic jerking, loss of consciousness, bit tongue, apnea or cyanosis.  Hypertension  This is a chronic problem. The current episode started more than 1 year ago. The problem has been gradually improving since onset. The problem is controlled. Pertinent negatives include no anxiety, blurred vision, chest pain, headaches, malaise/fatigue, neck pain, orthopnea, palpitations, peripheral edema, PND, shortness of breath or sweats. There are no associated agents to hypertension. There are no known risk factors for coronary artery disease. Past treatments include ACE inhibitors and diuretics. The current treatment provides mild improvement. There are no compliance problems.  There is no history of angina, kidney disease, CAD/MI, CVA, heart failure, left ventricular hypertrophy, PVD, renovascular disease or retinopathy. There is no history of chronic renal disease or a hypertension causing med.  Hyperlipidemia  This is a chronic problem. The current episode started more than 1 year ago. The problem is controlled. Recent lipid tests were reviewed and are normal. He has no history of chronic renal disease, diabetes, hypothyroidism, liver disease, obesity or nephrotic syndrome. There are no known factors aggravating his hyperlipidemia.  Pertinent negatives include no chest pain, focal sensory loss, focal weakness, leg pain, myalgias or shortness of breath. Current antihyperlipidemic treatment includes statins. The current treatment provides mild improvement of lipids. There are no compliance problems.   Arthritis  Presents for follow-up visit. The symptoms have been stable. Affected locations include the left wrist, right knee, right wrist and left knee. Pertinent negatives include no diarrhea, dysuria, fever, rash or weight loss.    No problem-specific Assessment & Plan notes found for this encounter.   Past Medical History:  Diagnosis Date  . Hyperlipidemia   . Hypertension   . Joint pain   . Seizures (HCC)     Past Surgical History:  Procedure Laterality Date  . RIB FRACTURE SURGERY      History reviewed. No pertinent family history.  Social History   Social History  . Marital status: Unknown    Spouse name: N/A  . Number of children: N/A  . Years of education: N/A   Occupational History  . Not on file.   Social History Main Topics  . Smoking status: Never Smoker  . Smokeless tobacco: Not on file  . Alcohol use No  . Drug use: No  . Sexual activity: Not Currently   Other Topics Concern  . Not on file   Social History Narrative  . No narrative on file    No Known Allergies   Review of Systems  Constitutional: Negative for chills, fever, malaise/fatigue and weight loss.  HENT: Negative for ear discharge, ear pain and sore throat.   Eyes: Negative for blurred vision and visual disturbance.  Respiratory: Negative for apnea, cough, sputum production, shortness of breath and wheezing.   Cardiovascular: Negative for chest pain,  palpitations, orthopnea, leg swelling, PND and cyanosis.  Gastrointestinal: Negative for abdominal pain, blood in stool, bowel incontinence, constipation, diarrhea, heartburn, melena, nausea and vomiting.  Genitourinary: Negative for bladder incontinence, dysuria,  frequency, hematuria and urgency.  Musculoskeletal: Positive for arthritis. Negative for back pain, joint pain, myalgias and neck pain.  Skin: Negative for rash.  Neurological: Positive for seizures. Negative for dizziness, tingling, sensory change, focal weakness, loss of consciousness, speech difficulty and headaches.  Endo/Heme/Allergies: Negative for environmental allergies and polydipsia. Does not bruise/bleed easily.  Psychiatric/Behavioral: Negative for confusion, depression and suicidal ideas. The patient is not nervous/anxious and does not have insomnia.      Objective  Vitals:   05/24/16 0956  BP: 120/80  Pulse: 60  Weight: 213 lb (96.6 kg)  Height: 6' (1.829 m)    Physical Exam  Constitutional: He is oriented to person, place, and time and well-developed, well-nourished, and in no distress.  HENT:  Head: Normocephalic.  Right Ear: External ear normal.  Left Ear: External ear normal.  Nose: Nose normal.  Mouth/Throat: Oropharynx is clear and moist.  Eyes: Conjunctivae and EOM are normal. Pupils are equal, round, and reactive to light. Right eye exhibits no discharge. Left eye exhibits no discharge. No scleral icterus.  Neck: Normal range of motion. Neck supple. No JVD present. No tracheal deviation present. No thyromegaly present.  Cardiovascular: Normal rate, regular rhythm, normal heart sounds and intact distal pulses.  Exam reveals no gallop and no friction rub.   No murmur heard. Pulmonary/Chest: Breath sounds normal. No respiratory distress. He has no wheezes. He has no rales.  Abdominal: Soft. Bowel sounds are normal. He exhibits no mass. There is no hepatosplenomegaly. There is no tenderness. There is no rebound, no guarding and no CVA tenderness.  Musculoskeletal: Normal range of motion. He exhibits no edema or tenderness.  Lymphadenopathy:    He has no cervical adenopathy.  Neurological: He is alert and oriented to person, place, and time. He has normal  sensation, normal strength, normal reflexes and intact cranial nerves. No cranial nerve deficit.  Skin: Skin is warm. No rash noted.  Psychiatric: Mood and affect normal.  Nursing note and vitals reviewed.     Assessment & Plan  Problem List Items Addressed This Visit      Cardiovascular and Mediastinum   Hypertension   Relevant Medications   lisinopril-hydrochlorothiazide (PRINZIDE,ZESTORETIC) 10-12.5 MG tablet   lovastatin (MEVACOR) 40 MG tablet   Other Relevant Orders   Renal Function Panel   Hepatic Function Panel (6)     Nervous and Auditory   Seizure disorder (HCC)   Relevant Medications   carbamazepine (TEGRETOL) 200 MG tablet   divalproex (DEPAKOTE) 500 MG DR tablet   Other Relevant Orders   Carbamazepine Level (Tegretol), total     Other   Hyperlipidemia   Relevant Medications   lisinopril-hydrochlorothiazide (PRINZIDE,ZESTORETIC) 10-12.5 MG tablet   lovastatin (MEVACOR) 40 MG tablet   Other Relevant Orders   Lipid Profile   Hepatic Function Panel (6)   Primary osteoarthritis of both knees   Relevant Medications   etodolac (LODINE) 500 MG tablet    Other Visit Diagnoses    Need for hepatitis C screening test    -  Primary   Relevant Orders   Hepatitis C antibody   Immunization due       Relevant Orders   Tdap vaccine greater than or equal to 7yo IM (Completed)        Dr. Elizabeth Sauereanna Jones [place]  [facility]  05/24/16

## 2016-05-25 LAB — HEPATITIS C ANTIBODY: HEP C VIRUS AB: 0.2 {s_co_ratio} (ref 0.0–0.9)

## 2016-05-25 LAB — RENAL FUNCTION PANEL
ALBUMIN: 4.2 g/dL (ref 3.5–5.5)
BUN / CREAT RATIO: 16 (ref 9–20)
BUN: 13 mg/dL (ref 6–24)
CALCIUM: 9 mg/dL (ref 8.7–10.2)
CHLORIDE: 95 mmol/L — AB (ref 96–106)
CO2: 30 mmol/L — ABNORMAL HIGH (ref 18–29)
CREATININE: 0.8 mg/dL (ref 0.76–1.27)
GFR calc Af Amer: 118 mL/min/{1.73_m2} (ref >59)
GFR calc non Af Amer: 102 mL/min/{1.73_m2} (ref >59)
Glucose: 99 mg/dL (ref 65–99)
Phosphorus: 2.5 mg/dL (ref 2.5–4.5)
Potassium: 4.8 mmol/L (ref 3.5–5.2)
Sodium: 139 mmol/L (ref 134–144)

## 2016-05-25 LAB — LIPID PANEL
CHOLESTEROL TOTAL: 248 mg/dL — AB (ref 100–199)
Chol/HDL Ratio: 2.9 ratio units (ref 0.0–5.0)
HDL: 86 mg/dL (ref >39)
LDL CALC: 124 mg/dL — AB (ref 0–99)
TRIGLYCERIDES: 188 mg/dL — AB (ref 0–149)
VLDL CHOLESTEROL CAL: 38 mg/dL (ref 5–40)

## 2016-05-25 LAB — HEPATIC FUNCTION PANEL (6)
ALT: 7 IU/L (ref 0–44)
AST: 10 IU/L (ref 0–40)
Alkaline Phosphatase: 45 IU/L (ref 39–117)
BILIRUBIN, DIRECT: 0.12 mg/dL (ref 0.00–0.40)
Bilirubin Total: 0.3 mg/dL (ref 0.0–1.2)

## 2016-05-25 LAB — CARBAMAZEPINE LEVEL, TOTAL: CARBAMAZEPINE LVL: 11.3 ug/mL (ref 4.0–12.0)

## 2016-10-24 DIAGNOSIS — M545 Low back pain: Secondary | ICD-10-CM | POA: Diagnosis not present

## 2016-10-24 DIAGNOSIS — I10 Essential (primary) hypertension: Secondary | ICD-10-CM | POA: Diagnosis not present

## 2016-10-24 DIAGNOSIS — E785 Hyperlipidemia, unspecified: Secondary | ICD-10-CM | POA: Diagnosis not present

## 2016-10-24 DIAGNOSIS — Z6828 Body mass index (BMI) 28.0-28.9, adult: Secondary | ICD-10-CM | POA: Diagnosis not present

## 2016-10-24 DIAGNOSIS — R413 Other amnesia: Secondary | ICD-10-CM | POA: Diagnosis not present

## 2016-10-24 DIAGNOSIS — E663 Overweight: Secondary | ICD-10-CM | POA: Diagnosis not present

## 2016-10-24 DIAGNOSIS — G40909 Epilepsy, unspecified, not intractable, without status epilepticus: Secondary | ICD-10-CM | POA: Diagnosis not present

## 2016-10-24 DIAGNOSIS — M158 Other polyosteoarthritis: Secondary | ICD-10-CM | POA: Diagnosis not present

## 2016-11-24 ENCOUNTER — Encounter: Payer: Self-pay | Admitting: Family Medicine

## 2016-11-24 ENCOUNTER — Ambulatory Visit (INDEPENDENT_AMBULATORY_CARE_PROVIDER_SITE_OTHER): Payer: Medicare PPO | Admitting: Family Medicine

## 2016-11-24 VITALS — BP 118/64 | HR 80 | Ht 72.0 in | Wt 208.0 lb

## 2016-11-24 DIAGNOSIS — M170 Bilateral primary osteoarthritis of knee: Secondary | ICD-10-CM | POA: Diagnosis not present

## 2016-11-24 DIAGNOSIS — G40909 Epilepsy, unspecified, not intractable, without status epilepticus: Secondary | ICD-10-CM

## 2016-11-24 DIAGNOSIS — I10 Essential (primary) hypertension: Secondary | ICD-10-CM

## 2016-11-24 DIAGNOSIS — E782 Mixed hyperlipidemia: Secondary | ICD-10-CM

## 2016-11-24 DIAGNOSIS — E784 Other hyperlipidemia: Secondary | ICD-10-CM | POA: Diagnosis not present

## 2016-11-24 DIAGNOSIS — E7849 Other hyperlipidemia: Secondary | ICD-10-CM

## 2016-11-24 MED ORDER — DIVALPROEX SODIUM 500 MG PO DR TAB: 500.0000 mg | DELAYED_RELEASE_TABLET | Freq: Three times a day (TID) | ORAL | 1 refills | Status: DC

## 2016-11-24 MED ORDER — CARBAMAZEPINE 200 MG PO TABS: 400.0000 mg | ORAL_TABLET | Freq: Three times a day (TID) | ORAL | 1 refills | Status: DC

## 2016-11-24 MED ORDER — ETODOLAC 500 MG PO TABS: 500.0000 mg | ORAL_TABLET | Freq: Two times a day (BID) | ORAL | 1 refills | Status: DC

## 2016-11-24 MED ORDER — LOVASTATIN 40 MG PO TABS: 40.0000 mg | ORAL_TABLET | Freq: Every day | ORAL | 1 refills | Status: DC

## 2016-11-24 MED ORDER — LISINOPRIL-HYDROCHLOROTHIAZIDE 10-12.5 MG PO TABS: 1.0000 | ORAL_TABLET | Freq: Every day | ORAL | 1 refills | Status: DC

## 2016-11-24 NOTE — Progress Notes (Signed)
Name: Jerome Gilmore   MRN: [id number]    DOB: 1960/09/01   Date:11/24/2016       Progress Note  Subjective  Chief Complaint  Chief Complaint  Patient presents with  . Hypertension  . Hyperlipidemia  . Seizures  . Joint Pain    has been taking etodolac for joint and knee pain- back of knee is starting to hurt him- ins suggested switching to meloxicam    Hypertension  This is a chronic problem. The current episode started more than 1 year ago. The problem is unchanged. The problem is controlled. Pertinent negatives include no anxiety, blurred vision, chest pain, headaches, malaise/fatigue, neck pain, orthopnea, palpitations, peripheral edema, PND, shortness of breath or sweats. There are no associated agents to hypertension. There are no known risk factors for coronary artery disease. Past treatments include ACE inhibitors and diuretics. The current treatment provides mild improvement. There are no compliance problems.  There is no history of angina, kidney disease, CAD/MI, CVA, heart failure, left ventricular hypertrophy, PVD or retinopathy. There is no history of chronic renal disease, a hypertension causing med or renovascular disease.  Hyperlipidemia  This is a chronic problem. The problem is controlled. Recent lipid tests were reviewed and are normal. Exacerbating diseases include obesity. He has no history of chronic renal disease, diabetes, hypothyroidism, liver disease or nephrotic syndrome. Pertinent negatives include no chest pain, focal sensory loss, focal weakness, leg pain, myalgias or shortness of breath. He is currently on no antihyperlipidemic treatment. The current treatment provides mild improvement of lipids. There are no compliance problems.  Risk factors for coronary artery disease include obesity.  Seizures   This is a chronic problem. The current episode started more than 1 week ago. The problem has been gradually improving. Pertinent negatives include no sleepiness, no  confusion, no headaches, no speech difficulty, no visual disturbance, no neck stiffness, no sore throat, no chest pain, no cough, no nausea, no vomiting, no diarrhea and no muscle weakness. Characteristics do not include eye blinking, eye deviation, bowel incontinence, bladder incontinence, rhythmic jerking, loss of consciousness, bit tongue, apnea or cyanosis.    No problem-specific Assessment & Plan notes found for this encounter.   Past Medical History:  Diagnosis Date  . Hyperlipidemia   . Hypertension   . Joint pain   . Seizures (HCC)     Past Surgical History:  Procedure Laterality Date  . RIB FRACTURE SURGERY      No family history on file.  Social History   Social History  . Marital status: Unknown    Spouse name: N/A  . Number of children: N/A  . Years of education: N/A   Occupational History  . Not on file.   Social History Main Topics  . Smoking status: Never Smoker  . Smokeless tobacco: Never Used  . Alcohol use No  . Drug use: No  . Sexual activity: Not Currently   Other Topics Concern  . Not on file   Social History Narrative  . No narrative on file    No Known Allergies  Outpatient Medications Prior to Visit  Medication Sig Dispense Refill  . carbamazepine (TEGRETOL) 200 MG tablet Take 2 tablets (400 mg total) by mouth 3 (three) times daily. 540 tablet 1  . divalproex (DEPAKOTE) 500 MG DR tablet Take 1 tablet (500 mg total) by mouth 3 (three) times daily. 270 tablet 1  . etodolac (LODINE) 500 MG tablet Take 1 tablet (500 mg total) by mouth 2 (two) times daily.  180 tablet 1  . lisinopril-hydrochlorothiazide (PRINZIDE,ZESTORETIC) 10-12.5 MG tablet Take 1 tablet by mouth daily. 90 tablet 1  . lovastatin (MEVACOR) 40 MG tablet Take 1 tablet (40 mg total) by mouth at bedtime. 90 tablet 1   No facility-administered medications prior to visit.     Review of Systems  Constitutional: Negative for chills, fever, malaise/fatigue and weight loss.  HENT:  Negative for ear discharge, ear pain and sore throat.   Eyes: Negative for blurred vision and visual disturbance.  Respiratory: Negative for apnea, cough, sputum production, shortness of breath and wheezing.   Cardiovascular: Negative for chest pain, palpitations, orthopnea, leg swelling, PND and cyanosis.  Gastrointestinal: Negative for abdominal pain, blood in stool, bowel incontinence, constipation, diarrhea, heartburn, melena, nausea and vomiting.  Genitourinary: Negative for bladder incontinence, dysuria, frequency, hematuria and urgency.  Musculoskeletal: Negative for back pain, joint pain, myalgias and neck pain.  Skin: Negative for rash.  Neurological: Positive for seizures. Negative for dizziness, tingling, sensory change, focal weakness, loss of consciousness, speech difficulty and headaches.  Endo/Heme/Allergies: Negative for environmental allergies and polydipsia. Does not bruise/bleed easily.  Psychiatric/Behavioral: Negative for confusion, depression and suicidal ideas. The patient is not nervous/anxious and does not have insomnia.      Objective  Vitals:   11/24/16 0823  BP: 118/64  Pulse: 80  Weight: 208 lb (94.3 kg)  Height: 6' (1.829 m)    Physical Exam  Constitutional: He is oriented to person, place, and time and well-developed, well-nourished, and in no distress.  HENT:  Head: Normocephalic.  Right Ear: Tympanic membrane, external ear and ear canal normal.  Left Ear: Tympanic membrane, external ear and ear canal normal.  Nose: Nose normal.  Mouth/Throat: Oropharynx is clear and moist.  Eyes: Conjunctivae and EOM are normal. Pupils are equal, round, and reactive to light. Right eye exhibits no discharge. Left eye exhibits no discharge. No scleral icterus.  Neck: Normal range of motion. Neck supple. No JVD present. No tracheal deviation present. No thyromegaly present.  Cardiovascular: Normal rate, regular rhythm, normal heart sounds and intact distal pulses.   Exam reveals no gallop and no friction rub.   No murmur heard. Pulmonary/Chest: Breath sounds normal. No respiratory distress. He has no wheezes. He has no rales.  Abdominal: Soft. Bowel sounds are normal. He exhibits no mass. There is no hepatosplenomegaly. There is no tenderness. There is no rebound, no guarding and no CVA tenderness.  Musculoskeletal: Normal range of motion. He exhibits no edema or tenderness.  Lymphadenopathy:    He has no cervical adenopathy.  Neurological: He is alert and oriented to person, place, and time. He has normal sensation, normal strength, normal reflexes and intact cranial nerves. No cranial nerve deficit.  Skin: Skin is warm. No rash noted.  Psychiatric: Mood and affect normal.  Nursing note and vitals reviewed.     Assessment & Plan  Problem List Items Addressed This Visit      Cardiovascular and Mediastinum   Hypertension - Primary   Relevant Medications   lovastatin (MEVACOR) 40 MG tablet   lisinopril-hydrochlorothiazide (PRINZIDE,ZESTORETIC) 10-12.5 MG tablet     Nervous and Auditory   Seizure disorder (HCC)   Relevant Medications   carbamazepine (TEGRETOL) 200 MG tablet   divalproex (DEPAKOTE) 500 MG DR tablet     Musculoskeletal and Integument   Primary osteoarthritis of both knees   Relevant Medications   etodolac (LODINE) 500 MG tablet     Other   Hyperlipidemia   Relevant Medications  lovastatin (MEVACOR) 40 MG tablet   lisinopril-hydrochlorothiazide (PRINZIDE,ZESTORETIC) 10-12.5 MG tablet      Meds ordered this encounter  Medications  . carbamazepine (TEGRETOL) 200 MG tablet    Sig: Take 2 tablets (400 mg total) by mouth 3 (three) times daily.    Dispense:  540 tablet    Refill:  1  . divalproex (DEPAKOTE) 500 MG DR tablet    Sig: Take 1 tablet (500 mg total) by mouth 3 (three) times daily.    Dispense:  270 tablet    Refill:  1  . etodolac (LODINE) 500 MG tablet    Sig: Take 1 tablet (500 mg total) by mouth 2 (two)  times daily.    Dispense:  180 tablet    Refill:  1  . lovastatin (MEVACOR) 40 MG tablet    Sig: Take 1 tablet (40 mg total) by mouth at bedtime.    Dispense:  90 tablet    Refill:  1  . lisinopril-hydrochlorothiazide (PRINZIDE,ZESTORETIC) 10-12.5 MG tablet    Sig: Take 1 tablet by mouth daily.    Dispense:  90 tablet    Refill:  1      Dr. [facility]uereanna Jones [facility]  11/24/16

## 2017-03-15 ENCOUNTER — Encounter: Payer: Self-pay | Admitting: Family Medicine

## 2017-03-15 ENCOUNTER — Ambulatory Visit (INDEPENDENT_AMBULATORY_CARE_PROVIDER_SITE_OTHER): Payer: Medicare PPO | Admitting: Family Medicine

## 2017-03-15 VITALS — BP 120/82 | HR 68 | Ht 72.0 in | Wt 201.0 lb

## 2017-03-15 DIAGNOSIS — H8309 Labyrinthitis, unspecified ear: Secondary | ICD-10-CM | POA: Diagnosis not present

## 2017-03-15 DIAGNOSIS — F329 Major depressive disorder, single episode, unspecified: Secondary | ICD-10-CM

## 2017-03-15 DIAGNOSIS — E860 Dehydration: Secondary | ICD-10-CM

## 2017-03-15 MED ORDER — MECLIZINE HCL 25 MG PO TABS: 25.0000 mg | ORAL_TABLET | Freq: Three times a day (TID) | ORAL | 0 refills | Status: DC | PRN

## 2017-03-15 NOTE — Progress Notes (Signed)
Name: Jerome Gilmore   MRN: [id number]    DOB: 1960/09/18   Date:03/15/2017       Progress Note  Subjective  Chief Complaint  Chief Complaint  Patient presents with  . Dizziness    "can't stand straight at all, tv looks like it is moving"- fasting BS- 87    Dizziness  This is a new problem. The current episode started in the past 7 days. The problem occurs constantly. The problem has been waxing and waning. Associated symptoms include vertigo and a visual change. Pertinent negatives include no abdominal pain, anorexia, arthralgias, change in bowel habit, chest pain, chills, congestion, coughing, diaphoresis, fatigue, fever, headaches, joint swelling, myalgias, nausea, neck pain, numbness, rash, sore throat, swollen glands, urinary symptoms, vomiting or weakness. Nothing aggravates the symptoms. He has tried position changes (standing) for the symptoms. The treatment provided moderate relief.  Depression       The patient presents with depression.  This is a new problem.  The current episode started more than 1 month ago.   The problem has been waxing and waning (had to cut all my cable off that's been my friend") since onset.  Associated symptoms include sad.  Associated symptoms include no decreased concentration, no fatigue, no helplessness, no hopelessness, does not have insomnia, not irritable, no restlessness, no decreased interest, no appetite change, no body aches, no myalgias, no headaches, no indigestion and no suicidal ideas.     The symptoms are aggravated by nothing.  Past treatments include nothing.  Past medical history includes depression.     No problem-specific Assessment & Plan notes found for this encounter.   Past Medical History:  Diagnosis Date  . Hyperlipidemia   . Hypertension   . Joint pain   . Seizures (HCC)     Past Surgical History:  Procedure Laterality Date  . RIB FRACTURE SURGERY      No family history on file.  Social History   Social History  .  Marital status: Unknown    Spouse name: N/A  . Number of children: N/A  . Years of education: N/A   Occupational History  . Not on file.   Social History Main Topics  . Smoking status: Never Smoker  . Smokeless tobacco: Never Used  . Alcohol use No  . Drug use: No  . Sexual activity: Not Currently   Other Topics Concern  . Not on file   Social History Narrative  . No narrative on file    No Known Allergies  Outpatient Medications Prior to Visit  Medication Sig Dispense Refill  . carbamazepine (TEGRETOL) 200 MG tablet Take 2 tablets (400 mg total) by mouth 3 (three) times daily. 540 tablet 1  . divalproex (DEPAKOTE) 500 MG DR tablet Take 1 tablet (500 mg total) by mouth 3 (three) times daily. 270 tablet 1  . etodolac (LODINE) 500 MG tablet Take 1 tablet (500 mg total) by mouth 2 (two) times daily. 180 tablet 1  . lisinopril-hydrochlorothiazide (PRINZIDE,ZESTORETIC) 10-12.5 MG tablet Take 1 tablet by mouth daily. 90 tablet 1  . lovastatin (MEVACOR) 40 MG tablet Take 1 tablet (40 mg total) by mouth at bedtime. 90 tablet 1   No facility-administered medications prior to visit.     Review of Systems  Constitutional: Negative for appetite change, chills, diaphoresis, fatigue, fever, malaise/fatigue and weight loss.  HENT: Negative for congestion, ear discharge, ear pain and sore throat.   Eyes: Negative for blurred vision.  Respiratory: Negative for cough, sputum  production, shortness of breath and wheezing.   Cardiovascular: Negative for chest pain, palpitations and leg swelling.  Gastrointestinal: Negative for abdominal pain, anorexia, blood in stool, change in bowel habit, constipation, diarrhea, heartburn, melena, nausea and vomiting.  Genitourinary: Negative for dysuria, frequency, hematuria and urgency.  Musculoskeletal: Negative for arthralgias, back pain, joint pain, joint swelling, myalgias and neck pain.  Skin: Negative for rash.  Neurological: Positive for dizziness  and vertigo. Negative for tingling, sensory change, focal weakness, weakness, numbness and headaches.  Endo/Heme/Allergies: Negative for environmental allergies and polydipsia. Does not bruise/bleed easily.  Psychiatric/Behavioral: Positive for depression. Negative for decreased concentration and suicidal ideas. The patient is not nervous/anxious and does not have insomnia.      Objective  Vitals:   03/15/17 1147  BP: 120/82  Pulse: 68  Weight: 201 lb (91.2 kg)  Height: 6' (1.829 m)    Physical Exam  Constitutional: He is oriented to person, place, and time and well-developed, well-nourished, and in no distress. He is not irritable.  HENT:  Head: Normocephalic.  Right Ear: External ear normal.  Left Ear: External ear normal.  Nose: Nose normal.  Mouth/Throat: Oropharynx is clear and moist. Mucous membranes are not pale, dry and not cyanotic.  Eyes: Pupils are equal, round, and reactive to light. Conjunctivae and EOM are normal. Right eye exhibits no discharge. Left eye exhibits no discharge. No scleral icterus.  Neck: Normal range of motion. Neck supple. No JVD present. No tracheal deviation present. No thyromegaly present.  Cardiovascular: Normal rate, regular rhythm, normal heart sounds and intact distal pulses.  Exam reveals no gallop and no friction rub.   No murmur heard. Pulmonary/Chest: Breath sounds normal. No respiratory distress. He has no wheezes. He has no rales.  Abdominal: Soft. Bowel sounds are normal. He exhibits no mass. There is no hepatosplenomegaly. There is no tenderness. There is no rebound, no guarding and no CVA tenderness.  Musculoskeletal: Normal range of motion. He exhibits no edema or tenderness.  Lymphadenopathy:    He has no cervical adenopathy.  Neurological: He is alert and oriented to person, place, and time. He has normal sensation, normal strength, normal reflexes and intact cranial nerves. No cranial nerve deficit.  Skin: Skin is warm. No rash  noted.  Psychiatric: Mood and affect normal.  Nursing note and vitals reviewed.     Assessment & Plan  Problem List Items Addressed This Visit    None    Visit Diagnoses    Labyrinthitis, unspecified laterality    -  Primary   Relevant Medications   meclizine (ANTIVERT) 25 MG tablet   Dehydration       encourage fluids   Reactive depression       likely baseline      Meds ordered this encounter  Medications  . meclizine (ANTIVERT) 25 MG tablet    Sig: Take 1 tablet (25 mg total) by mouth 3 (three) times daily as needed for dizziness.    Dispense:  30 tablet    Refill:  0      Dr. Hayden [facility]  03/15/17

## 2017-03-15 NOTE — Patient Instructions (Addendum)
Dizziness Dizziness is a common problem. It makes you feel unsteady or light-headed. You may feel like you are about to pass out (faint). Dizziness can lead to getting hurt if you stumble or fall. Dizziness can be caused by many things, including:  Medicines.  Not having enough water in your body (dehydration).  Illness.  Follow these instructions at home: Eating and drinking  Drink enough fluid to keep your pee (urine) clear or pale yellow. This helps to keep you from getting dehydrated. Try to drink more clear fluids, such as water.  Do not drink alcohol.  Limit how much caffeine you drink or eat, if your doctor tells you to do that.  Limit how much salt (sodium) you drink or eat, if your doctor tells you to do that. Activity  Avoid making quick movements. ? When you stand up from sitting in a chair, steady yourself until you feel okay. ? In the morning, first sit up on the side of the bed. When you feel okay, stand slowly while you hold onto something. Do this until you know that your balance is fine.  If you need to stand in one place for a long time, move your legs often. Tighten and relax the muscles in your legs while you are standing.  Do not drive or use heavy machinery if you feel dizzy.  Avoid bending down if you feel dizzy. Place items in your home so you can reach them easily without leaning over. Lifestyle  Do not use any products that contain nicotine or tobacco, such as cigarettes and e-cigarettes. If you need help quitting, ask your doctor.  Try to lower your stress level. You can do this by using methods such as yoga or meditation. Talk with your doctor if you need help. General instructions  Watch your dizziness for any changes.  Take over-the-counter and prescription medicines only as told by your doctor. Talk with your doctor if you think that you are dizzy because of a medicine that you are taking.  Tell a friend or a family member that you are feeling  dizzy. If he or she notices any changes in your behavior, have this person call your doctor.  Keep all follow-up visits as told by your doctor. This is important. Contact a doctor if:  Your dizziness does not go away.  Your dizziness or light-headedness gets worse.  You feel sick to your stomach (nauseous).  You have trouble hearing.  You have new symptoms.  You are unsteady on your feet.  You feel like the room is spinning. Get help right away if:  You throw up (vomit) or have watery poop (diarrhea), and you cannot eat or drink anything.  You have trouble: ? Talking. ? Walking. ? Swallowing. ? Using your arms, hands, or legs.  You feel generally weak.  You are not thinking clearly, or you have trouble forming sentences. A friend or family member may notice this.  You have: ? Chest pain. ? Pain in your belly (abdomen). ? Shortness of breath. ? Sweating.  Your vision changes.  You are bleeding.  You have a very bad headache.  You have neck pain or a stiff neck.  You have a fever. These symptoms may be an emergency. Do not wait to see if the symptoms will go away. Get medical help right away. Call your local emergency services (911 in the [place] Do not drive yourself to the hospital. Summary  Dizziness makes you feel unsteady or light-headed. You may  feel like you are about to pass out (faint).  Drink enough fluid to keep your pee (urine) clear or pale yellow. Do not drink alcohol.  Avoid making quick movements if you feel dizzy.  Watch your dizziness for any changes. This information is not intended to replace advice given to you by your health care provider. Make sure you discuss any questions you have with your health care provider. Document Released: 05/18/2011 Document Revised: 06/15/2016 Document Reviewed: 06/15/2016 Elsevier Interactive Patient Education  2017 [employer]  Dehydration, Adult Dehydration is when there is not enough fluid or water  in your body. This happens when you lose more fluids than you take in. Dehydration can range from mild to very bad. It should be treated right away to keep it from getting very bad. Symptoms of mild dehydration may include:  Thirst.  Dry lips.  Slightly dry mouth.  Dry, warm skin.  Dizziness. Symptoms of moderate dehydration may include:  Very dry mouth.  Muscle cramps.  Dark pee (urine). Pee may be the color of tea.  Your body making less pee.  Your eyes making fewer tears.  Heartbeat that is uneven or faster than normal (palpitations).  Headache.  Light-headedness, especially when you stand up from sitting.  Fainting (syncope). Symptoms of very bad dehydration may include:  Changes in skin, such as: ? Cold and clammy skin. ? Blotchy (mottled) or pale skin. ? Skin that does not quickly return to normal after being lightly pinched and let go (poor skin turgor).  Changes in body fluids, such as: ? Feeling very thirsty. ? Your eyes making fewer tears. ? Not sweating when body temperature is high, such as in hot weather. ? Your body making very little pee.  Changes in vital signs, such as: ? Weak pulse. ? Pulse that is more than 100 beats a minute when you are sitting still. ? Fast breathing. ? Low blood pressure.  Other changes, such as: ? Sunken eyes. ? Cold hands and feet. ? Confusion. ? Lack of energy (lethargy). ? Trouble waking up from sleep. ? Short-term weight loss. ? Unconsciousness. Follow these instructions at home:  If told by your doctor, drink an ORS: ? Make an ORS by using instructions on the package. ? Start by drinking small amounts, about  cup (120 mL) every 5-10 minutes. ? Slowly drink more until you have had the amount that your doctor said to have.  Drink enough clear fluid to keep your pee clear or pale yellow. If you were told to drink an ORS, finish the ORS first, then start slowly drinking clear fluids. Drink fluids such  as: ? Water. Do not drink only water by itself. Doing that can make the salt (sodium) level in your body get too low (hyponatremia). ? Ice chips. ? Fruit juice that you have added water to (diluted). ? Low-calorie sports drinks.  Avoid: ? Alcohol. ? Drinks that have a lot of sugar. These include high-calorie sports drinks, fruit juice that does not have water added, and soda. ? Caffeine. ? Foods that are greasy or have a lot of fat or sugar.  Take over-the-counter and prescription medicines only as told by your doctor.  Do not take salt tablets. Doing that can make the salt level in your body get too high (hypernatremia).  Eat foods that have minerals (electrolytes). Examples include bananas, oranges, potatoes, tomatoes, and spinach.  Keep all follow-up visits as told by your doctor. This is important. Contact a doctor if:  You have belly (abdominal) pain that: ? Gets worse. ? Stays in one area (localizes).  You have a rash.  You have a stiff neck.  You get angry or annoyed more easily than normal (irritability).  You are more sleepy than normal.  You have a harder time waking up than normal.  You feel: ? Weak. ? Dizzy. ? Very thirsty.  You have peed (urinated) only a small amount of very dark pee during 6-8 hours. Get help right away if:  You have symptoms of very bad dehydration.  You cannot drink fluids without throwing up (vomiting).  Your symptoms get worse with treatment.  You have a fever.  You have a very bad headache.  You are throwing up or having watery poop (diarrhea) and it: ? Gets worse. ? Does not go away.  You have blood or something green (bile) in your throw-up.  You have blood in your poop (stool). This may cause poop to look black and tarry.  You have not peed in 6-8 hours.  You pass out (faint).  Your heart rate when you are sitting still is more than 100 beats a minute.  You have trouble breathing. This information is not  intended to replace advice given to you by your health care provider. Make sure you discuss any questions you have with your health care provider. Document Released: 03/25/2009 Document Revised: 12/17/2015 Document Reviewed: 07/23/2015 Elsevier Interactive Patient Education  2018 [employer].

## 2017-03-26 ENCOUNTER — Other Ambulatory Visit: Payer: Self-pay

## 2017-03-26 ENCOUNTER — Telehealth: Payer: Self-pay | Admitting: Family Medicine

## 2017-03-26 NOTE — Telephone Encounter (Signed)
Pt came in stating eyes need to be looked at. We talked to him and saw him on 10/2- checked BS then was good. Thought he was dehydrated but pt says he has increased fluid intake. Sched appt with [facility] in [place] on 03/29/17 @ 9:00

## 2017-03-29 DIAGNOSIS — H40003 Preglaucoma, unspecified, bilateral: Secondary | ICD-10-CM | POA: Diagnosis not present

## 2017-03-31 DIAGNOSIS — I10 Essential (primary) hypertension: Secondary | ICD-10-CM | POA: Diagnosis not present

## 2017-03-31 DIAGNOSIS — M6281 Muscle weakness (generalized): Secondary | ICD-10-CM | POA: Diagnosis not present

## 2017-03-31 DIAGNOSIS — H538 Other visual disturbances: Secondary | ICD-10-CM | POA: Diagnosis not present

## 2017-03-31 DIAGNOSIS — R531 Weakness: Secondary | ICD-10-CM | POA: Diagnosis not present

## 2017-03-31 DIAGNOSIS — G40909 Epilepsy, unspecified, not intractable, without status epilepticus: Secondary | ICD-10-CM | POA: Diagnosis not present

## 2017-03-31 DIAGNOSIS — H409 Unspecified glaucoma: Secondary | ICD-10-CM | POA: Diagnosis not present

## 2017-03-31 DIAGNOSIS — E7800 Pure hypercholesterolemia, unspecified: Secondary | ICD-10-CM | POA: Diagnosis not present

## 2017-03-31 DIAGNOSIS — R42 Dizziness and giddiness: Secondary | ICD-10-CM | POA: Diagnosis not present

## 2017-03-31 DIAGNOSIS — R2689 Other abnormalities of gait and mobility: Secondary | ICD-10-CM | POA: Diagnosis not present

## 2017-03-31 DIAGNOSIS — H532 Diplopia: Secondary | ICD-10-CM | POA: Diagnosis not present

## 2017-05-22 ENCOUNTER — Other Ambulatory Visit: Payer: Self-pay

## 2017-05-22 ENCOUNTER — Telehealth: Payer: Self-pay

## 2017-05-22 ENCOUNTER — Telehealth: Payer: Self-pay | Admitting: Family Medicine

## 2017-05-22 DIAGNOSIS — G40909 Epilepsy, unspecified, not intractable, without status epilepticus: Secondary | ICD-10-CM

## 2017-05-22 MED ORDER — DIVALPROEX SODIUM 500 MG PO DR TAB: 500.0000 mg | DELAYED_RELEASE_TABLET | Freq: Three times a day (TID) | ORAL | 0 refills | Status: DC

## 2017-05-22 MED ORDER — CARBAMAZEPINE 200 MG PO TABS: 400.0000 mg | ORAL_TABLET | Freq: Three times a day (TID) | ORAL | 0 refills | Status: DC

## 2017-05-22 NOTE — Telephone Encounter (Signed)
Pt called wanting divalproex and carbamazepine refilled with [facility]- sent in both meds

## 2017-05-22 NOTE — Telephone Encounter (Signed)
done

## 2017-05-22 NOTE — Telephone Encounter (Signed)
Patient is requesting refill just til his appointment for carbamazepine (TEGRETOL) 200 MG tablet and divalproex (DEPAKOTE) 500 MG DR tablet .

## 2017-05-28 ENCOUNTER — Encounter: Payer: Self-pay | Admitting: Family Medicine

## 2017-05-28 ENCOUNTER — Ambulatory Visit: Payer: Medicare PPO | Admitting: Family Medicine

## 2017-05-28 DIAGNOSIS — G40909 Epilepsy, unspecified, not intractable, without status epilepticus: Secondary | ICD-10-CM

## 2017-05-28 DIAGNOSIS — E7849 Other hyperlipidemia: Secondary | ICD-10-CM

## 2017-05-28 DIAGNOSIS — I10 Essential (primary) hypertension: Secondary | ICD-10-CM | POA: Diagnosis not present

## 2017-05-28 MED ORDER — CARBAMAZEPINE 200 MG PO TABS: 400.0000 mg | ORAL_TABLET | Freq: Three times a day (TID) | ORAL | 0 refills | Status: DC

## 2017-05-28 MED ORDER — LISINOPRIL-HYDROCHLOROTHIAZIDE 10-12.5 MG PO TABS: 1.0000 | ORAL_TABLET | Freq: Every day | ORAL | 1 refills | Status: DC

## 2017-05-28 MED ORDER — DIVALPROEX SODIUM 500 MG PO DR TAB: 500.0000 mg | DELAYED_RELEASE_TABLET | Freq: Three times a day (TID) | ORAL | 0 refills | Status: DC

## 2017-05-28 MED ORDER — LOVASTATIN 40 MG PO TABS: 40.0000 mg | ORAL_TABLET | Freq: Every day | ORAL | 1 refills | Status: DC

## 2017-05-28 NOTE — Progress Notes (Signed)
Name: Jerome Gilmore   MRN: [id number]    DOB: 02/05/1961   Date:05/28/2017       Progress Note  Subjective  Chief Complaint  Chief Complaint  Patient presents with  . Hypertension    Please review Rx bottles he has some that will run out before he gets the mail order in,.    Hypertension  This is a chronic problem. The current episode started more than 1 year ago. The problem is controlled. Pertinent negatives include no anxiety, blurred vision, chest pain, headaches, malaise/fatigue, neck pain, orthopnea, palpitations, peripheral edema, PND, shortness of breath or sweats. There are no associated agents to hypertension. There are no known risk factors for coronary artery disease. Past treatments include ACE inhibitors and diuretics. The current treatment provides moderate improvement. There are no compliance problems.  There is no history of angina, kidney disease, CAD/MI, CVA, heart failure, left ventricular hypertrophy or PVD. There is no history of chronic renal disease, a hypertension causing med or renovascular disease.  Hyperlipidemia  This is a chronic problem. The problem is controlled. Exacerbating diseases include obesity. He has no history of chronic renal disease, diabetes, hypothyroidism, liver disease or nephrotic syndrome. Factors aggravating his hyperlipidemia include thiazides. Pertinent negatives include no chest pain, focal sensory loss, focal weakness, leg pain, myalgias or shortness of breath. The current treatment provides no improvement of lipids. There are no compliance problems.  There are no known risk factors for coronary artery disease.  Seizures   This is a chronic (for refills of carbamezine and depakote) problem. Episode onset: years. Progression since onset: stable. Pertinent negatives include no sleepiness, no confusion, no headaches, no speech difficulty, no neck stiffness, no sore throat, no chest pain, no cough, no nausea, no vomiting, no diarrhea and no muscle  weakness.    No problem-specific Assessment & Plan notes found for this encounter.   Past Medical History:  Diagnosis Date  . Hyperlipidemia   . Hypertension   . Joint pain   . Seizures (HCC)     Past Surgical History:  Procedure Laterality Date  . RIB FRACTURE SURGERY      History reviewed. No pertinent family history.  Social History   Socioeconomic History  . Marital status: Unknown    Spouse name: Not on file  . Number of children: Not on file  . Years of education: Not on file  . Highest education level: Not on file  Social Needs  . Financial resource strain: Not on file  . Food insecurity - worry: Not on file  . Food insecurity - inability: Not on file  . Transportation needs - medical: Not on file  . Transportation needs - non-medical: Not on file  Occupational History  . Not on file  Tobacco Use  . Smoking status: Never Smoker  . Smokeless tobacco: Never Used  Substance and Sexual Activity  . Alcohol use: No    Alcohol/week: 0.0 oz  . Drug use: No  . Sexual activity: Not Currently  Other Topics Concern  . Not on file  Social History Narrative  . Not on file    No Known Allergies  Outpatient Medications Prior to Visit  Medication Sig Dispense Refill  . carbamazepine (TEGRETOL) 200 MG tablet Take 2 tablets (400 mg total) by mouth 3 (three) times daily. 540 tablet 0  . divalproex (DEPAKOTE) 500 MG DR tablet Take 1 tablet (500 mg total) by mouth 3 (three) times daily. 270 tablet 0  . lisinopril-hydrochlorothiazide (PRINZIDE,ZESTORETIC) 10-12.5 MG  tablet Take 1 tablet by mouth daily. 90 tablet 1  . lovastatin (MEVACOR) 40 MG tablet Take 1 tablet (40 mg total) by mouth at bedtime. 90 tablet 1  . etodolac (LODINE) 500 MG tablet Take 1 tablet (500 mg total) by mouth 2 (two) times daily. 180 tablet 1  . meclizine (ANTIVERT) 25 MG tablet Take 1 tablet (25 mg total) by mouth 3 (three) times daily as needed for dizziness. 30 tablet 0   No  facility-administered medications prior to visit.     Review of Systems  Constitutional: Negative for malaise/fatigue.  HENT: Negative for sore throat.   Eyes: Negative for blurred vision.  Respiratory: Negative for cough and shortness of breath.   Cardiovascular: Negative for chest pain, palpitations, orthopnea and PND.  Gastrointestinal: Negative for diarrhea, nausea and vomiting.  Musculoskeletal: Negative for myalgias and neck pain.  Neurological: Positive for seizures. Negative for focal weakness, speech difficulty and headaches.  Psychiatric/Behavioral: Negative for confusion.     Objective  Vitals:   05/28/17 1055  BP: 118/78  Pulse: (!) 58  Resp: 16  SpO2: 100%  Weight: 205 lb (93 kg)  Height: 6' (1.829 m)    Physical Exam  Constitutional: He is oriented to person, place, and time and well-developed, well-nourished, and in no distress.  HENT:  Head: Normocephalic.  Right Ear: External ear normal.  Left Ear: External ear normal.  Nose: Nose normal.  Mouth/Throat: Oropharynx is clear and moist.  Eyes: Conjunctivae and EOM are normal. Pupils are equal, round, and reactive to light. Right eye exhibits no discharge. Left eye exhibits no discharge. No scleral icterus.  Neck: Normal range of motion. Neck supple. No JVD present. No tracheal deviation present. No thyromegaly present.  Cardiovascular: Normal rate, regular rhythm, normal heart sounds and intact distal pulses. Exam reveals no gallop and no friction rub.  No murmur heard. Pulmonary/Chest: Breath sounds normal. No respiratory distress. He has no wheezes. He has no rales.  Abdominal: Soft. Bowel sounds are normal. He exhibits no mass. There is no hepatosplenomegaly. There is no tenderness. There is no rebound, no guarding and no CVA tenderness.  Musculoskeletal: Normal range of motion. He exhibits no edema or tenderness.  Lymphadenopathy:    He has no cervical adenopathy.  Neurological: He is alert and oriented  to person, place, and time. He has normal sensation, normal strength, normal reflexes and intact cranial nerves. No cranial nerve deficit.  Skin: Skin is warm. No rash noted.  Psychiatric: Mood and affect normal.  Nursing note and vitals reviewed.     Assessment & Plan  Problem List Items Addressed This Visit      Cardiovascular and Mediastinum   Hypertension   Relevant Medications   lisinopril-hydrochlorothiazide (PRINZIDE,ZESTORETIC) 10-12.5 MG tablet   lovastatin (MEVACOR) 40 MG tablet     Nervous and Auditory   Seizure disorder (HCC)   Relevant Medications   carbamazepine (TEGRETOL) 200 MG tablet   divalproex (DEPAKOTE) 500 MG DR tablet     Other   Hyperlipidemia   Relevant Medications   lisinopril-hydrochlorothiazide (PRINZIDE,ZESTORETIC) 10-12.5 MG tablet   lovastatin (MEVACOR) 40 MG tablet      Meds ordered this encounter  Medications  . lisinopril-hydrochlorothiazide (PRINZIDE,ZESTORETIC) 10-12.5 MG tablet    Sig: Take 1 tablet by mouth daily.    Dispense:  90 tablet    Refill:  1  . lovastatin (MEVACOR) 40 MG tablet    Sig: Take 1 tablet (40 mg total) by mouth at bedtime.  Dispense:  90 tablet    Refill:  1  . carbamazepine (TEGRETOL) 200 MG tablet    Sig: Take 2 tablets (400 mg total) by mouth 3 (three) times daily.    Dispense:  540 tablet    Refill:  0  . divalproex (DEPAKOTE) 500 MG DR tablet    Sig: Take 1 tablet (500 mg total) by mouth 3 (three) times daily.    Dispense:  270 tablet    Refill:  0      Dr. Elizabeth Sauer [facility]  05/28/17

## 2017-11-10 DIAGNOSIS — I10 Essential (primary) hypertension: Secondary | ICD-10-CM | POA: Diagnosis not present

## 2017-11-10 DIAGNOSIS — E785 Hyperlipidemia, unspecified: Secondary | ICD-10-CM | POA: Diagnosis not present

## 2017-11-10 DIAGNOSIS — R413 Other amnesia: Secondary | ICD-10-CM | POA: Diagnosis not present

## 2017-11-10 DIAGNOSIS — E663 Overweight: Secondary | ICD-10-CM | POA: Diagnosis not present

## 2017-11-10 DIAGNOSIS — G40909 Epilepsy, unspecified, not intractable, without status epilepticus: Secondary | ICD-10-CM | POA: Diagnosis not present

## 2017-11-10 DIAGNOSIS — M545 Low back pain: Secondary | ICD-10-CM | POA: Diagnosis not present

## 2017-11-10 DIAGNOSIS — Z6829 Body mass index (BMI) 29.0-29.9, adult: Secondary | ICD-10-CM | POA: Diagnosis not present

## 2017-11-10 DIAGNOSIS — Z87820 Personal history of traumatic brain injury: Secondary | ICD-10-CM | POA: Diagnosis not present

## 2017-11-12 ENCOUNTER — Ambulatory Visit: Payer: Medicare PPO | Admitting: Family Medicine

## 2017-11-12 ENCOUNTER — Encounter: Payer: Self-pay | Admitting: Family Medicine

## 2017-11-12 VITALS — BP 120/80 | HR 64 | Ht 72.0 in | Wt 203.0 lb

## 2017-11-12 DIAGNOSIS — G40909 Epilepsy, unspecified, not intractable, without status epilepticus: Secondary | ICD-10-CM

## 2017-11-12 DIAGNOSIS — E782 Mixed hyperlipidemia: Secondary | ICD-10-CM

## 2017-11-12 DIAGNOSIS — I10 Essential (primary) hypertension: Secondary | ICD-10-CM | POA: Diagnosis not present

## 2017-11-12 NOTE — Progress Notes (Signed)
Name: Jerome Gilmore   MRN: [id number]    DOB: [birth year]   Date:11/12/2017       Progress Note  Subjective  Chief Complaint  Chief Complaint  Patient presents with  . Hyperlipidemia  . Hypertension  . Seizures    Hyperlipidemia  This is a chronic problem. The current episode started more than 1 year ago. The problem is controlled. Recent lipid tests were reviewed and are normal. Exacerbating diseases include obesity. He has no history of chronic renal disease, diabetes, hypothyroidism, liver disease or nephrotic syndrome. There are no known factors aggravating his hyperlipidemia. Pertinent negatives include no chest pain, focal sensory loss, focal weakness, leg pain, myalgias or shortness of breath. Current antihyperlipidemic treatment includes statins. The current treatment provides moderate improvement of lipids. There are no compliance problems.  There are no known risk factors for coronary artery disease.  Hypertension  This is a chronic problem. The current episode started more than 1 year ago. The problem has been waxing and waning since onset. The problem is controlled. Pertinent negatives include no anxiety, blurred vision, chest pain, headaches, malaise/fatigue, neck pain, orthopnea, palpitations, peripheral edema, PND, shortness of breath or sweats. There are no associated agents to hypertension. There are no known risk factors for coronary artery disease. Past treatments include ACE inhibitors and diuretics. The current treatment provides moderate improvement. There are no compliance problems.  There is no history of angina, kidney disease, CAD/MI, CVA, heart failure, left ventricular hypertrophy, PVD or retinopathy. There is no history of chronic renal disease.  Seizures   This is a chronic problem. Episode onset: no seizure activity. The problem has not changed since onset.Number of times: 0. Pertinent negatives include no headaches, no sore throat, no chest pain, no cough, no nausea and  no diarrhea. Characteristics do not include rhythmic jerking. uncertain The episode was not witnessed. There was no sensation of an aura present. The seizure(s) had no focality. There has been no fever.    Seizure disorder (HCC) Controlled on depakote 500mg  tid and carbamazepine 200 mg bid. Will check levels and maintenance labwork.  Hyperlipidemia Controlled on mevacor 40 mg at present dosage q day  Hypertension Stable on lisinopril-HCTZ 10/12.5 ,with no change in dosage   Past Medical History:  Diagnosis Date  . Hyperlipidemia   . Hypertension   . Joint pain   . Seizures (HCC)     Past Surgical History:  Procedure Laterality Date  . RIB FRACTURE SURGERY      No family history on file.  Social History   Socioeconomic History  . Marital status: Unknown    Spouse name: Not on file  . Number of children: Not on file  . Years of education: Not on file  . Highest education level: Not on file  Occupational History  . Not on file  Social Needs  . Financial resource strain: Not on file  . Food insecurity:    Worry: Not on file    Inability: Not on file  . Transportation needs:    Medical: Not on file    Non-medical: Not on file  Tobacco Use  . Smoking status: Never Smoker  . Smokeless tobacco: Never Used  Substance and Sexual Activity  . Alcohol use: No    Alcohol/week: 0.0 oz  . Drug use: No  . Sexual activity: Not Currently  Lifestyle  . Physical activity:    Days per week: Not on file    Minutes per session: Not on file  .  Stress: Not on file  Relationships  . Social connections:    Talks on phone: Not on file    Gets together: Not on file    Attends religious service: Not on file    Active member of club or organization: Not on file    Attends meetings of clubs or organizations: Not on file    Relationship status: Not on file  . Intimate partner violence:    Fear of current or ex partner: Not on file    Emotionally abused: Not on file    Physically  abused: Not on file    Forced sexual activity: Not on file  Other Topics Concern  . Not on file  Social History Narrative  . Not on file    No Known Allergies  Outpatient Medications Prior to Visit  Medication Sig Dispense Refill  . carbamazepine (TEGRETOL) 200 MG tablet Take 2 tablets (400 mg total) by mouth 3 (three) times daily. 540 tablet 0  . divalproex (DEPAKOTE) 500 MG DR tablet Take 1 tablet (500 mg total) by mouth 3 (three) times daily. 270 tablet 0  . lisinopril-hydrochlorothiazide (PRINZIDE,ZESTORETIC) 10-12.5 MG tablet Take 1 tablet by mouth daily. 90 tablet 1  . lovastatin (MEVACOR) 40 MG tablet Take 1 tablet (40 mg total) by mouth at bedtime. 90 tablet 1   No facility-administered medications prior to visit.     Review of Systems  Constitutional: Negative for chills, fever, malaise/fatigue and weight loss.  HENT: Negative for ear discharge, ear pain and sore throat.   Eyes: Negative for blurred vision.  Respiratory: Negative for cough, sputum production, shortness of breath and wheezing.   Cardiovascular: Negative for chest pain, palpitations, orthopnea, leg swelling and PND.  Gastrointestinal: Negative for abdominal pain, blood in stool, constipation, diarrhea, heartburn, melena and nausea.  Genitourinary: Negative for dysuria, frequency, hematuria and urgency.  Musculoskeletal: Negative for back pain, joint pain, myalgias and neck pain.  Skin: Negative for rash.  Neurological: Positive for seizures. Negative for dizziness, tingling, sensory change, focal weakness and headaches.  Endo/Heme/Allergies: Negative for environmental allergies and polydipsia. Does not bruise/bleed easily.  Psychiatric/Behavioral: Negative for depression and suicidal ideas. The patient is not nervous/anxious and does not have insomnia.      Objective  Vitals:   11/12/17 1352  BP: 120/80  Pulse: 64  Weight: 203 lb (92.1 kg)  Height: 6' (1.829 m)    Physical Exam  Constitutional:  He is oriented to person, place, and time.  HENT:  Head: Normocephalic.  Right Ear: External ear normal.  Left Ear: External ear normal.  Nose: Nose normal.  Mouth/Throat: Oropharynx is clear and moist.  Eyes: Pupils are equal, round, and reactive to light. Conjunctivae and EOM are normal. Right eye exhibits no discharge. Left eye exhibits no discharge. No scleral icterus.  Neck: Normal range of motion. Neck supple. No JVD present. No tracheal deviation present. No thyromegaly present.  Cardiovascular: Normal rate, regular rhythm, normal heart sounds and intact distal pulses. Exam reveals no gallop and no friction rub.  No murmur heard. Pulmonary/Chest: Breath sounds normal. No respiratory distress. He has no wheezes. He has no rales.  Abdominal: Soft. Bowel sounds are normal. He exhibits no mass. There is no hepatosplenomegaly. There is no tenderness. There is no rebound, no guarding and no CVA tenderness.  Musculoskeletal: Normal range of motion. He exhibits no edema or tenderness.  Lymphadenopathy:    He has no cervical adenopathy.  Neurological: He is alert and oriented to person,  place, and time. He has normal strength and normal reflexes. No cranial nerve deficit.  Skin: Skin is warm. No rash noted.      Assessment & Plan  Problem List Items Addressed This Visit      Cardiovascular and Mediastinum   Hypertension    Stable on lisinopril-HCTZ 10/12.5 ,with no change in dosage      Relevant Orders   Renal Function Panel     Nervous and Auditory   Seizure disorder (HCC) - Primary    Controlled on depakote 500mg  tid and carbamazepine 200 mg bid. Will check levels and maintenance labwork.      Relevant Orders   Valproic Acid level   Carbamazepine Level (Tegretol), total     Other   Hyperlipidemia    Controlled on mevacor 40 mg at present dosage q day         No orders of the defined types were placed in this encounter.     Dr. Hayden Rasmusseneanna Mikaele Stecher [facility]  11/12/17

## 2017-11-12 NOTE — Assessment & Plan Note (Signed)
Controlled on depakote 500mg  tid and carbamazepine 200 mg bid. Will check levels and maintenance labwork.

## 2017-11-12 NOTE — Assessment & Plan Note (Signed)
Controlled on mevacor 40 mg at present dosage q day

## 2017-11-12 NOTE — Assessment & Plan Note (Signed)
Stable on lisinopril-HCTZ 10/12.5 ,with no change in dosage

## 2017-11-13 LAB — RENAL FUNCTION PANEL
Albumin: 4.5 g/dL (ref 3.5–5.5)
BUN / CREAT RATIO: 18 (ref 9–20)
BUN: 16 mg/dL (ref 6–24)
CHLORIDE: 96 mmol/L (ref 96–106)
CO2: 25 mmol/L (ref 20–29)
Calcium: 9 mg/dL (ref 8.7–10.2)
Creatinine, Ser: 0.87 mg/dL (ref 0.76–1.27)
GFR calc non Af Amer: 97 mL/min/{1.73_m2} (ref >59)
GFR, EST AFRICAN AMERICAN: 112 mL/min/{1.73_m2} (ref >59)
GLUCOSE: 83 mg/dL (ref 65–99)
POTASSIUM: 4.3 mmol/L (ref 3.5–5.2)
Phosphorus: 3.6 mg/dL (ref 2.5–4.5)
Sodium: 136 mmol/L (ref 134–144)

## 2017-11-13 LAB — VALPROIC ACID LEVEL: VALPROIC ACID LVL: 118 ug/mL — AB (ref 50–100)

## 2017-11-13 LAB — CARBAMAZEPINE LEVEL, TOTAL: Carbamazepine (Tegretol), S: 13.3 ug/mL (ref 4.0–12.0)

## 2017-11-26 ENCOUNTER — Other Ambulatory Visit: Payer: Self-pay

## 2017-11-26 DIAGNOSIS — G40909 Epilepsy, unspecified, not intractable, without status epilepticus: Secondary | ICD-10-CM

## 2017-11-26 DIAGNOSIS — I10 Essential (primary) hypertension: Secondary | ICD-10-CM

## 2017-11-26 DIAGNOSIS — E7849 Other hyperlipidemia: Secondary | ICD-10-CM

## 2017-11-26 MED ORDER — DIVALPROEX SODIUM 500 MG PO DR TAB: 500.0000 mg | DELAYED_RELEASE_TABLET | Freq: Three times a day (TID) | ORAL | 0 refills | Status: DC

## 2017-11-26 MED ORDER — LOVASTATIN 40 MG PO TABS: 40.0000 mg | ORAL_TABLET | Freq: Every day | ORAL | 1 refills | Status: DC

## 2017-11-26 MED ORDER — CARBAMAZEPINE 200 MG PO TABS: 400.0000 mg | ORAL_TABLET | Freq: Three times a day (TID) | ORAL | 0 refills | Status: DC

## 2017-11-26 MED ORDER — LISINOPRIL-HYDROCHLOROTHIAZIDE 10-12.5 MG PO TABS: 1.0000 | ORAL_TABLET | Freq: Every day | ORAL | 1 refills | Status: DC

## 2018-02-20 ENCOUNTER — Encounter: Payer: Self-pay | Admitting: Family Medicine

## 2018-02-20 ENCOUNTER — Ambulatory Visit: Payer: Medicare PPO | Admitting: Family Medicine

## 2018-02-20 VITALS — BP 120/80 | HR 72 | Ht 72.0 in | Wt 207.0 lb

## 2018-02-20 DIAGNOSIS — E7849 Other hyperlipidemia: Secondary | ICD-10-CM | POA: Diagnosis not present

## 2018-02-20 DIAGNOSIS — I10 Essential (primary) hypertension: Secondary | ICD-10-CM

## 2018-02-20 DIAGNOSIS — M1711 Unilateral primary osteoarthritis, right knee: Secondary | ICD-10-CM

## 2018-02-20 DIAGNOSIS — G40909 Epilepsy, unspecified, not intractable, without status epilepticus: Secondary | ICD-10-CM

## 2018-02-20 MED ORDER — CARBAMAZEPINE 200 MG PO TABS: 400.0000 mg | ORAL_TABLET | Freq: Three times a day (TID) | ORAL | 1 refills | Status: DC

## 2018-02-20 MED ORDER — LISINOPRIL-HYDROCHLOROTHIAZIDE 10-12.5 MG PO TABS: 1.0000 | ORAL_TABLET | Freq: Every day | ORAL | 1 refills | Status: DC

## 2018-02-20 MED ORDER — LOVASTATIN 40 MG PO TABS: 40.0000 mg | ORAL_TABLET | Freq: Every day | ORAL | 1 refills | Status: DC

## 2018-02-20 MED ORDER — DIVALPROEX SODIUM 500 MG PO DR TAB: 500.0000 mg | DELAYED_RELEASE_TABLET | Freq: Three times a day (TID) | ORAL | 1 refills | Status: DC

## 2018-02-20 NOTE — Assessment & Plan Note (Addendum)
Stable on meds- refill lovastatin/ draw lipid

## 2018-02-20 NOTE — Assessment & Plan Note (Signed)
Stable on meds- refill lisinopril- hctz

## 2018-02-20 NOTE — Assessment & Plan Note (Signed)
Refilled carbamazepine and divalproex

## 2018-02-20 NOTE — Progress Notes (Signed)
Name: Jerome Gilmore   MRN: [id number]    DOB: 1960/11/12   Date:02/20/2018       Progress Note  Subjective  Chief Complaint  Chief Complaint  Patient presents with  . Hypertension  . Hyperlipidemia  . Seizures    Hypertension  This is a chronic problem. The current episode started more than 1 year ago. The problem has been gradually improving since onset. The problem is controlled. Pertinent negatives include no anxiety, blurred vision, chest pain, headaches, malaise/fatigue, neck pain, orthopnea, palpitations, peripheral edema, PND, shortness of breath or sweats. There are no associated agents to hypertension. There are no known risk factors for coronary artery disease. Past treatments include ACE inhibitors and diuretics. The current treatment provides moderate improvement. There are no compliance problems.  There is no history of angina, kidney disease, CVA, heart failure, left ventricular hypertrophy, PVD or retinopathy. There is no history of chronic renal disease, a hypertension causing med or renovascular disease.  Hyperlipidemia  This is a chronic problem. The current episode started more than 1 year ago. The problem is controlled. Recent lipid tests were reviewed and are normal. He has no history of chronic renal disease, diabetes, hypothyroidism, liver disease, obesity or nephrotic syndrome. There are no known factors aggravating his hyperlipidemia. Pertinent negatives include no chest pain, focal sensory loss, focal weakness, leg pain, myalgias or shortness of breath. Current antihyperlipidemic treatment includes statins. The current treatment provides moderate improvement of lipids. There are no compliance problems.  Risk factors for coronary artery disease include hypertension, dyslipidemia, male sex, post-menopausal and obesity.  Seizures   This is a chronic (on depakote and Tegratol) problem. Progression since onset: stable. Pertinent negatives include no sleepiness, no confusion, no  headaches, no speech difficulty, no visual disturbance, no neck stiffness, no sore throat, no chest pain, no cough, no nausea, no vomiting, no diarrhea and no muscle weakness.    Seizure disorder (HCC) Refilled carbamazepine and divalproex  Hypertension Stable on meds- refill lisinopril- hctz  Hyperlipidemia Stable on meds- refill lovastatin/ draw lipid   Past Medical History:  Diagnosis Date  . Hyperlipidemia   . Hypertension   . Joint pain   . Seizures (HCC)     Past Surgical History:  Procedure Laterality Date  . RIB FRACTURE SURGERY      History reviewed. No pertinent family history.  Social History   Socioeconomic History  . Marital status: Unknown    Spouse name: Not on file  . Number of children: Not on file  . Years of education: Not on file  . Highest education level: Not on file  Occupational History  . Not on file  Social Needs  . Financial resource strain: Not on file  . Food insecurity:    Worry: Not on file    Inability: Not on file  . Transportation needs:    Medical: Not on file    Non-medical: Not on file  Tobacco Use  . Smoking status: Never Smoker  . Smokeless tobacco: Never Used  Substance and Sexual Activity  . Alcohol use: No    Alcohol/week: 0.0 standard drinks  . Drug use: No  . Sexual activity: Not Currently  Lifestyle  . Physical activity:    Days per week: Not on file    Minutes per session: Not on file  . Stress: Not on file  Relationships  . Social connections:    Talks on phone: Not on file    Gets together: Not on file  Attends religious service: Not on file    Active member of club or organization: Not on file    Attends meetings of clubs or organizations: Not on file    Relationship status: Not on file  . Intimate partner violence:    Fear of current or ex partner: Not on file    Emotionally abused: Not on file    Physically abused: Not on file    Forced sexual activity: Not on file  Other Topics Concern  .  Not on file  Social History Narrative  . Not on file    No Known Allergies  Outpatient Medications Prior to Visit  Medication Sig Dispense Refill  . carbamazepine (TEGRETOL) 200 MG tablet Take 2 tablets (400 mg total) by mouth 3 (three) times daily. 42 tablet 0  . divalproex (DEPAKOTE) 500 MG DR tablet Take 1 tablet (500 mg total) by mouth 3 (three) times daily. 21 tablet 0  . lisinopril-hydrochlorothiazide (PRINZIDE,ZESTORETIC) 10-12.5 MG tablet Take 1 tablet by mouth daily. 90 tablet 1  . lovastatin (MEVACOR) 40 MG tablet Take 1 tablet (40 mg total) by mouth at bedtime. 90 tablet 1   No facility-administered medications prior to visit.     Review of Systems  Constitutional: Negative for chills, fever, malaise/fatigue and weight loss.  HENT: Negative for ear discharge, ear pain and sore throat.   Eyes: Negative for blurred vision and visual disturbance.  Respiratory: Negative for cough, sputum production, shortness of breath and wheezing.   Cardiovascular: Negative for chest pain, palpitations, orthopnea, leg swelling and PND.  Gastrointestinal: Negative for abdominal pain, blood in stool, constipation, diarrhea, heartburn, melena, nausea and vomiting.  Genitourinary: Negative for dysuria, frequency, hematuria and urgency.  Musculoskeletal: Negative for back pain, joint pain, myalgias and neck pain.  Skin: Negative for rash.  Neurological: Positive for seizures. Negative for dizziness, tingling, sensory change, focal weakness, speech difficulty and headaches.  Endo/Heme/Allergies: Negative for environmental allergies and polydipsia. Does not bruise/bleed easily.  Psychiatric/Behavioral: Negative for confusion, depression and suicidal ideas. The patient is not nervous/anxious and does not have insomnia.      Objective  Vitals:   02/20/18 0909  BP: 120/80  Pulse: 72  Weight: 207 lb (93.9 kg)  Height: 6' (1.829 m)    Physical Exam  Constitutional: He is oriented to person,  place, and time.  HENT:  Head: Normocephalic.  Right Ear: External ear normal.  Left Ear: External ear normal.  Nose: Nose normal.  Mouth/Throat: Oropharynx is clear and moist.  Eyes: Pupils are equal, round, and reactive to light. Conjunctivae and EOM are normal. Right eye exhibits no discharge. Left eye exhibits no discharge. No scleral icterus.  Neck: Normal range of motion. Neck supple. No JVD present. No tracheal deviation present. No thyromegaly present.  Cardiovascular: Normal rate, regular rhythm, normal heart sounds and intact distal pulses. Exam reveals no gallop and no friction rub.  No murmur heard. Pulmonary/Chest: Breath sounds normal. No respiratory distress. He has no wheezes. He has no rales.  Abdominal: Soft. Bowel sounds are normal. He exhibits no mass. There is no hepatosplenomegaly. There is no tenderness. There is no rebound, no guarding and no CVA tenderness.  Musculoskeletal: Normal range of motion. He exhibits no edema or tenderness.  Lymphadenopathy:    He has no cervical adenopathy.  Neurological: He is alert and oriented to person, place, and time. He has normal strength and normal reflexes. No cranial nerve deficit.  Skin: Skin is warm. No rash noted.  Nursing note and vitals reviewed.     Assessment & Plan  Problem List Items Addressed This Visit      Cardiovascular and Mediastinum   Hypertension - Primary    Stable on meds- refill lisinopril- hctz      Relevant Medications   lisinopril-hydrochlorothiazide (PRINZIDE,ZESTORETIC) 10-12.5 MG tablet   lovastatin (MEVACOR) 40 MG tablet     Nervous and Auditory   Seizure disorder (HCC)    Refilled carbamazepine and divalproex      Relevant Medications   carbamazepine (TEGRETOL) 200 MG tablet   divalproex (DEPAKOTE) 500 MG DR tablet     Musculoskeletal and Integument   Primary osteoarthritis of right knee     Other   Hyperlipidemia    Stable on meds- refill lovastatin/ draw lipid       Relevant Medications   lisinopril-hydrochlorothiazide (PRINZIDE,ZESTORETIC) 10-12.5 MG tablet   lovastatin (MEVACOR) 40 MG tablet   Other Relevant Orders   Lipid panel      Meds ordered this encounter  Medications  . carbamazepine (TEGRETOL) 200 MG tablet    Sig: Take 2 tablets (400 mg total) by mouth 3 (three) times daily.    Dispense:  540 tablet    Refill:  1  . divalproex (DEPAKOTE) 500 MG DR tablet    Sig: Take 1 tablet (500 mg total) by mouth 3 (three) times daily.    Dispense:  270 tablet    Refill:  1  . lisinopril-hydrochlorothiazide (PRINZIDE,ZESTORETIC) 10-12.5 MG tablet    Sig: Take 1 tablet by mouth daily.    Dispense:  90 tablet    Refill:  1  . lovastatin (MEVACOR) 40 MG tablet    Sig: Take 1 tablet (40 mg total) by mouth at bedtime.    Dispense:  90 tablet    Refill:  1      Dr. Elizabeth Sauer [facility]  02/20/18

## 2018-02-21 LAB — LIPID PANEL
Chol/HDL Ratio: 2.8 ratio (ref 0.0–5.0)
Cholesterol, Total: 232 mg/dL — ABNORMAL HIGH (ref 100–199)
HDL: 83 mg/dL (ref >39)
LDL Calculated: 111 mg/dL — ABNORMAL HIGH (ref 0–99)
Triglycerides: 188 mg/dL — ABNORMAL HIGH (ref 0–149)
VLDL Cholesterol Cal: 38 mg/dL (ref 5–40)

## 2018-04-07 ENCOUNTER — Other Ambulatory Visit: Payer: Self-pay

## 2018-04-07 ENCOUNTER — Emergency Department: Payer: Medicare PPO

## 2018-04-07 ENCOUNTER — Emergency Department
Admission: EM | Admit: 2018-04-07 | Discharge: 2018-04-07 | Disposition: A | Discharge status: Home / Self Care | Payer: Medicare PPO | Attending: Emergency Medicine | Admitting: Emergency Medicine

## 2018-04-07 ENCOUNTER — Encounter: Payer: Self-pay | Admitting: Emergency Medicine

## 2018-04-07 DIAGNOSIS — Z79899 Other long term (current) drug therapy: Secondary | ICD-10-CM | POA: Diagnosis not present

## 2018-04-07 DIAGNOSIS — R9431 Abnormal electrocardiogram [ECG] [EKG]: Secondary | ICD-10-CM | POA: Insufficient documentation

## 2018-04-07 DIAGNOSIS — E860 Dehydration: Secondary | ICD-10-CM | POA: Insufficient documentation

## 2018-04-07 DIAGNOSIS — I9589 Other hypotension: Secondary | ICD-10-CM | POA: Insufficient documentation

## 2018-04-07 DIAGNOSIS — I10 Essential (primary) hypertension: Secondary | ICD-10-CM | POA: Insufficient documentation

## 2018-04-07 DIAGNOSIS — I959 Hypotension, unspecified: Secondary | ICD-10-CM | POA: Diagnosis not present

## 2018-04-07 DIAGNOSIS — R42 Dizziness and giddiness: Secondary | ICD-10-CM | POA: Diagnosis not present

## 2018-04-07 DIAGNOSIS — E871 Hypo-osmolality and hyponatremia: Secondary | ICD-10-CM | POA: Diagnosis not present

## 2018-04-07 DIAGNOSIS — E861 Hypovolemia: Secondary | ICD-10-CM | POA: Insufficient documentation

## 2018-04-07 LAB — CBC WITH DIFFERENTIAL/PLATELET
Abs Immature Granulocytes: 0.04 10*3/uL (ref 0.00–0.07)
BASOS ABS: 0 10*3/uL (ref 0.0–0.1)
BASOS PCT: 1 %
EOS ABS: 0.1 10*3/uL (ref 0.0–0.5)
EOS PCT: 1 %
HCT: 39.8 % (ref 39.0–52.0)
Hemoglobin: 14.2 g/dL (ref 13.0–17.0)
Immature Granulocytes: 1 %
Lymphocytes Relative: 14 %
Lymphs Abs: 1.2 10*3/uL (ref 0.7–4.0)
MCH: 32.1 pg (ref 26.0–34.0)
MCHC: 35.7 g/dL (ref 30.0–36.0)
MCV: 90 fL (ref 80.0–100.0)
Monocytes Absolute: 1 10*3/uL (ref 0.1–1.0)
Monocytes Relative: 11 %
NRBC: 0 % (ref 0.0–0.2)
Neutro Abs: 6.5 10*3/uL (ref 1.7–7.7)
Neutrophils Relative %: 72 %
PLATELETS: 221 10*3/uL (ref 150–400)
RBC: 4.42 MIL/uL (ref 4.22–5.81)
RDW: 11.9 % (ref 11.5–15.5)
WBC: 8.8 10*3/uL (ref 4.0–10.5)

## 2018-04-07 LAB — URINALYSIS, COMPLETE (UACMP) WITH MICROSCOPIC
BILIRUBIN URINE: NEGATIVE
Bacteria, UA: NONE SEEN
Glucose, UA: NEGATIVE mg/dL
HGB URINE DIPSTICK: NEGATIVE
Ketones, ur: 5 mg/dL — AB
Leukocytes, UA: NEGATIVE
NITRITE: NEGATIVE
PH: 7 (ref 5.0–8.0)
PROTEIN: NEGATIVE mg/dL
SPECIFIC GRAVITY, URINE: 1.006 (ref 1.005–1.030)
Squamous Epithelial / LPF: NONE SEEN (ref 0–5)

## 2018-04-07 LAB — BASIC METABOLIC PANEL
ANION GAP: 5 (ref 5–15)
BUN: 14 mg/dL (ref 6–20)
CALCIUM: 8 mg/dL — AB (ref 8.9–10.3)
CO2: 29 mmol/L (ref 22–32)
CREATININE: 0.92 mg/dL (ref 0.61–1.24)
Chloride: 95 mmol/L — ABNORMAL LOW (ref 98–111)
GFR calc Af Amer: >60 mL/min (ref >60)
GFR calc non Af Amer: >60 mL/min (ref >60)
GLUCOSE: 107 mg/dL — AB (ref 70–99)
Potassium: 4.3 mmol/L (ref 3.5–5.1)
Sodium: 129 mmol/L — ABNORMAL LOW (ref 135–145)

## 2018-04-07 LAB — COMPREHENSIVE METABOLIC PANEL
ALBUMIN: 4.1 g/dL (ref 3.5–5.0)
ALT: 12 U/L (ref 0–44)
AST: 15 U/L (ref 15–41)
Alkaline Phosphatase: 46 U/L (ref 38–126)
Anion gap: 11 (ref 5–15)
BUN: 15 mg/dL (ref 6–20)
CHLORIDE: 89 mmol/L — AB (ref 98–111)
CO2: 28 mmol/L (ref 22–32)
Calcium: 8.8 mg/dL — ABNORMAL LOW (ref 8.9–10.3)
Creatinine, Ser: 1.21 mg/dL (ref 0.61–1.24)
GFR calc Af Amer: >60 mL/min (ref >60)
Glucose, Bld: 125 mg/dL — ABNORMAL HIGH (ref 70–99)
Potassium: 4.5 mmol/L (ref 3.5–5.1)
Sodium: 128 mmol/L — ABNORMAL LOW (ref 135–145)
TOTAL PROTEIN: 7.1 g/dL (ref 6.5–8.1)
Total Bilirubin: 0.4 mg/dL (ref 0.3–1.2)

## 2018-04-07 LAB — TROPONIN I

## 2018-04-07 LAB — LACTIC ACID, PLASMA: Lactic Acid, Venous: 1.4 mmol/L (ref 0.5–1.9)

## 2018-04-07 LAB — ETHANOL

## 2018-04-07 MED ORDER — SODIUM CHLORIDE 0.9 % IV BOLUS
1000.0000 mL | Freq: Once | INTRAVENOUS | Status: AC
  Administered 2018-04-07: 1000 mL via INTRAVENOUS

## 2018-04-07 NOTE — ED Provider Notes (Signed)
-----------------------------------------   4:16 PM on 04/07/2018 -----------------------------------------   Blood pressure (!) 127/52, pulse 66, temperature 97.6 F (36.4 C), temperature source Oral, resp. rate 14, height 5\' 11"  (1.803 m), weight 102.1 kg, SpO2 99 %.  Assuming care from Dr. Alphonzo Lemmings of [facility] is a 57 y.o. male with a chief complaint of Hypotension .    Please refer to H&P by previous MD for further details.  The current plan of care is to pending repeat BMP and 2nd troponin. Patient remains CP free. Repeat troponin remains negative. Repeat BMP shows improvement of creatinine. Na slightly improved. Patient is asymptomatic. Recommended slightly increase in dietary salt for the next 2 days and close f/u with PCP for re-check labs. Also recommended checking BP tomorrow morning before taking any antihypertensives and holding meds if BP< 120/80. Discussed strict return precautions with patient and family.        Nita Sickle, MD 04/07/18 [phone]

## 2018-04-07 NOTE — Discharge Instructions (Addendum)
You must drink plenty of fluid and eat before taking her blood pressure medication.  If your BP is 120/80 or less, hold your blood pressure medication. If you have chest pain, shortness of breath, or you feel worse in any way including fever, vomiting chills or other concerns return to the emergency department.  Please follow-up closely with cardiology as your EKG is somewhat abnormal, we do not know exactly what this means because we do not have an old EKG for you, but they would like you to see them in their office.

## 2018-04-07 NOTE — ED Notes (Signed)
Pt up to bedside toilet. Denies dizziness.

## 2018-04-07 NOTE — ED Triage Notes (Signed)
Pt presents to ED via [facility] with c/o hypotensive, pt takes BP meds but unable to state which ones. Per EMS pt has had intermittent dizziness since Friday. EMS reports cool and diaphoretic on EMS arrival. Per EMS pt BP 85/48, CBG 166, HR 64.

## 2018-04-07 NOTE — ED Provider Notes (Addendum)
[facility] Emergency Department Provider Note  ____________________________________________   I have reviewed the triage vital signs and the nursing notes. Where available I have reviewed prior notes and, if possible and indicated, outside hospital notes.    HISTORY  Chief Complaint Hypotension    HPI Jerome Gilmore is a 57 y.o. 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  At church, he became lightheaded.  He did not pass out but felt like he was going to EMS found him with some low blood pressure.  He has not had any other symptoms.  Specifically no chest pain no shortness of breath no nausea no vomiting no headache no dysuria no urinary symptoms.  He states he has had some mild rhinorrhea and has not been eating very much recently just because he has been busy.  He denies any seizure activity, he is taking his medication as prescribed.  He has had no focal numbness or weakness, he did not have any melena or bright red blood per rectum constipation or diarrhea he denies any vomiting, he states that he actually besides his head cold felt fine until this morning.  He states he woke up feeling fine except for his head cold went to church after taking his blood pressure medications and not eating and drinking anything and then became lightheaded.  He did not feel lightheaded yesterday and he states his been feeling fine over the last couple days.  This is not some distinction to what he reported to the triage nurse apparently.  Patient states he feels pretty good right now "".  Blood sugar was reassuring for EMS.      Past  Medical History:  Diagnosis Date  . Hyperlipidemia   . Hypertension   . Joint pain   . Seizures [facility])     Patient Active Problem List   Diagnosis Date Noted  . Primary osteoarthritis of both knees 05/24/2016  . Hyperlipidemia 11/25/2015  . Seizure disorder (HCC) 11/25/2015  . Primary osteoarthritis of right knee 11/25/2015  . Seizure (HCC) 12/03/2014  . Hypertension 11/26/2014    Past Surgical History:  Procedure Laterality Date  . RIB FRACTURE SURGERY      Prior to Admission medications   Medication Sig Start Date End Date Taking? Authorizing Provider  carbamazepine (TEGRETOL) 200 MG tablet Take 2 tablets (400 mg total) by mouth 3 (three) times daily. 02/20/18   Duanne Limerick, MD  divalproex (DEPAKOTE) 500 MG DR tablet Take 1 tablet (500 mg total) by mouth 3 (three) times daily. 02/20/18   Duanne Limerick, MD  lisinopril-hydrochlorothiazide (PRINZIDE,ZESTORETIC) 10-12.5 MG tablet Take 1 tablet by mouth daily. 02/20/18   Duanne Limerick, MD  lovastatin (MEVACOR) 40 MG tablet Take 1 tablet (40 mg total) by mouth at bedtime. 02/20/18   Duanne Limerick, MD    Allergies Patient has no known allergies.  No family history on file.  Social History Social History   Tobacco Use  . Smoking status: Never Smoker  . Smokeless tobacco: Never Used  Substance Use Topics  . Alcohol use: No    Alcohol/week: 0.0 standard  drinks  . Drug use: No    Review of Systems Constitutional: No fever/chills Eyes: No visual changes. ENT: No sore throat. No stiff neck no neck pain Cardiovascular: Denies chest pain. Respiratory: Denies shortness of breath. Gastrointestinal:   no vomiting.  No diarrhea.  No constipation. Genitourinary: Negative for dysuria. Musculoskeletal: Negative lower extremity swelling Skin: Negative for rash. Neurological: Negative for severe headaches, focal weakness or numbness.   ____________________________________________   PHYSICAL EXAM:  VITAL SIGNS: ED  Triage Vitals  Enc Vitals Group     BP 04/07/18 1146 92/69     Pulse Rate 04/07/18 1146 66     Resp 04/07/18 1146 14     Temp 04/07/18 1146 97.6 F (36.4 C)     Temp Source 04/07/18 1146 Oral     SpO2 04/07/18 1146 100 %     Weight 04/07/18 1145 225 lb (102.1 kg)     Height 04/07/18 1145 5\' 11"  (1.803 m)     Head Circumference --      Peak Flow --      Pain Score 04/07/18 1145 0     Pain Loc --      Pain Edu? --      Excl. in GC? --     Constitutional: Alert and oriented. Well appearing and in no acute distress. Eyes: Conjunctivae are normal Head: Atraumatic HEENT: Mild clear congestion/rhinnorhea. Mucous membranes are moist.  Oropharynx non-erythematous Neck:   Nontender with no meningismus, no masses, no stridor Cardiovascular: Normal rate, regular rhythm. Grossly normal heart sounds.  Good peripheral circulation. Respiratory: Normal respiratory effort.  No retractions. Lungs CTAB. Abdominal: Soft and nontender. No distention. No guarding no rebound Back:  There is no focal tenderness or step off.  there is no midline tenderness there are no lesions noted. there is no CVA tenderness Musculoskeletal: No lower extremity tenderness, no upper extremity tenderness. No joint effusions, no DVT signs strong distal pulses no edema Neurologic:  Normal speech and language. No gross focal neurologic deficits are appreciated.  Skin:  Skin is warm, dry and intact. No rash noted. Psychiatric: Mood and affect are normal. Speech and behavior are normal.  ____________________________________________   LABS (all labs ordered are listed, but only abnormal results are displayed)  Labs Reviewed  LACTIC ACID, PLASMA  LACTIC ACID, PLASMA  URINALYSIS, COMPLETE (UACMP) WITH MICROSCOPIC  TROPONIN I  CBC WITH DIFFERENTIAL/PLATELET  COMPREHENSIVE METABOLIC PANEL  ETHANOL    Pertinent labs  results that were available during my care of the patient were reviewed by me and considered in my medical  decision making (see chart for details). ____________________________________________  EKG  I personally interpreted any EKGs ordered by me or triage Sinus rhythm, rate 64 bpm, diffuse ST changes including anteriorly V1 to 3 of ST changes and borderline ST depression, no old EKG for comparison no ST elevation. ____________________________________________  RADIOLOGY  Pertinent labs & imaging results that were available during my care of the patient were reviewed by me and considered in my medical decision making (see chart for details). If possible, patient and/or family made aware of any abnormal findings.  No results found. ____________________________________________    PROCEDURES  Procedure(s) performed: None  Procedures  Critical Care performed: None  ____________________________________________   INITIAL IMPRESSION / ASSESSMENT AND PLAN / ED COURSE  Pertinent labs & imaging results that were available during my care of the patient were reviewed by me and considered in my medical decision making (see chart for details).  Felt  lightheaded this morning, he did not have anything to eat or drink and did have his medications for his blood pressure.  Certainly could be a medication reaction.  EKG is of concern he has no chest pain or shortness of breath we will send troponins, chest x-ray, and further evaluate the patient.  We are giving him IV fluid we will check a lactic to see if there is any evidence of occult sepsis although I have low suspicion, and we will reassess.  Clinically does not appear to have a GI bleed and I have low suspicion for PE  ----------------------------------------- 1:18 PM on 04/07/2018 -----------------------------------------  Pressure responding well after 700 cc of fluid blood pressure is currently 110/77, he remains asymptomatic at this time we give him food as he is hungry, His lactic is negative suggesting is not having sepsis there is no  evidence of infection of any significant variety troponin is negative CBC is reassuring he is not anemic white count is normal, sodium is slightly low he has been low before we are giving him fluid which I think will bring it up on not certain a sodium 120 it is enough to mandate admission I am confident that with some fluid this will come out.  Chest x-ray is reassuring belly exam remains benign.  ----------------------------------------- 3:19 PM on 04/07/2018 -----------------------------------------  Discussed with Dr. and, he and I evaluated the patient's EKG, he feels that there is no reason to keep the patient here because of the EKG as long as his vitals have regularized and his repeat troponin is negative.  Patient remains asymptomatic here, we will check orthostatic vital signs prior to going.  At this time it really appears that the patient is somewhat dehydrated creatinine is minimally up, and he had nothing to eat or drink this morning, minimal food last night but because he sometimes does not eat, and then took his blood pressure medications on an empty stomach.  There is no indication for the diagnosis of sepsis cardiogenic shock neurogenic shock GI bleed or other acute pathology that can result in hypotension this does seem to be medicinally mediated.  Patient is eating and drinking here. Signed out to dr. Don Perking at the end of my shift.   ____________________________________________   FINAL CLINICAL IMPRESSION(S) / ED DIAGNOSES  Final diagnoses:  None      This chart was dictated using voice recognition software.  Despite best efforts to proofread,  errors can occur which can change meaning.      Jeanmarie Plant, MD 04/07/18 1215    Jeanmarie Plant, MD 04/07/18 1319    Jeanmarie Plant, MD 04/07/18 1520    Jeanmarie Plant, MD 04/07/18 1520

## 2018-04-07 NOTE — ED Notes (Signed)
Pt has tremor to R hand.

## 2018-04-07 NOTE — ED Notes (Signed)
Repeat EKG completed per verbal order Dr Alphonzo Lemmings.

## 2018-04-18 DIAGNOSIS — H40003 Preglaucoma, unspecified, bilateral: Secondary | ICD-10-CM | POA: Diagnosis not present

## 2018-05-14 DIAGNOSIS — H40003 Preglaucoma, unspecified, bilateral: Secondary | ICD-10-CM | POA: Diagnosis not present

## 2018-05-24 DIAGNOSIS — H40003 Preglaucoma, unspecified, bilateral: Secondary | ICD-10-CM | POA: Diagnosis not present

## 2018-08-08 ENCOUNTER — Encounter: Payer: Self-pay | Admitting: Family Medicine

## 2018-08-08 ENCOUNTER — Ambulatory Visit: Payer: Medicare PPO | Admitting: Family Medicine

## 2018-08-08 VITALS — BP 132/80 | HR 72 | Ht 71.0 in | Wt 210.0 lb

## 2018-08-08 DIAGNOSIS — I10 Essential (primary) hypertension: Secondary | ICD-10-CM

## 2018-08-08 DIAGNOSIS — G40909 Epilepsy, unspecified, not intractable, without status epilepticus: Secondary | ICD-10-CM | POA: Diagnosis not present

## 2018-08-08 DIAGNOSIS — E7849 Other hyperlipidemia: Secondary | ICD-10-CM

## 2018-08-08 DIAGNOSIS — M1990 Unspecified osteoarthritis, unspecified site: Secondary | ICD-10-CM | POA: Diagnosis not present

## 2018-08-08 DIAGNOSIS — R69 Illness, unspecified: Secondary | ICD-10-CM

## 2018-08-08 MED ORDER — DIVALPROEX SODIUM 500 MG PO DR TAB: 500.0000 mg | DELAYED_RELEASE_TABLET | Freq: Three times a day (TID) | ORAL | 1 refills | Status: DC

## 2018-08-08 MED ORDER — LISINOPRIL-HYDROCHLOROTHIAZIDE 10-12.5 MG PO TABS: 1.0000 | ORAL_TABLET | Freq: Every day | ORAL | 1 refills | Status: DC

## 2018-08-08 MED ORDER — CARBAMAZEPINE 200 MG PO TABS: 400.0000 mg | ORAL_TABLET | Freq: Three times a day (TID) | ORAL | 1 refills | Status: DC

## 2018-08-08 MED ORDER — LOVASTATIN 40 MG PO TABS: 40.0000 mg | ORAL_TABLET | Freq: Every day | ORAL | 1 refills | Status: DC

## 2018-08-08 MED ORDER — ETODOLAC 500 MG PO TABS: 500.0000 mg | ORAL_TABLET | Freq: Two times a day (BID) | ORAL | 1 refills | Status: DC

## 2018-08-08 NOTE — Progress Notes (Signed)
Date:  08/08/2018   Name:  Jerome Gilmore   DOB:  March 20, 1961   MRN:  [id number]   Chief Complaint: Hypertension; Hyperlipidemia; and Seizures  Hypertension  This is a chronic problem. The current episode started more than 1 year ago. The problem is unchanged. The problem is controlled. Pertinent negatives include no anxiety, blurred vision, chest pain, headaches, malaise/fatigue, neck pain, orthopnea, palpitations, peripheral edema, PND, shortness of breath or sweats. There are no associated agents to hypertension. There are no known risk factors for coronary artery disease. Past treatments include ACE inhibitors and diuretics. The current treatment provides moderate improvement. There are no compliance problems.  There is no history of angina, kidney disease, CAD/MI, CVA, heart failure, left ventricular hypertrophy, PVD or retinopathy. There is no history of chronic renal disease, a hypertension causing med or renovascular disease.  Hyperlipidemia  This is a chronic problem. The current episode started more than 1 year ago. The problem is controlled. Recent lipid tests were reviewed and are normal. He has no history of chronic renal disease, diabetes, hypothyroidism, liver disease, obesity or nephrotic syndrome. Factors aggravating his hyperlipidemia include thiazides. Pertinent negatives include no chest pain, focal sensory loss, focal weakness, leg pain, myalgias or shortness of breath. The current treatment provides moderate improvement of lipids. There are no compliance problems.  There are no known risk factors for coronary artery disease.  Seizures   This is a chronic problem. The problem has not changed (noseizure activity for years) since onset.Pertinent negatives include no sleepiness, no confusion, no headaches, no speech difficulty, no visual disturbance, no neck stiffness, no sore throat, no chest pain, no cough, no nausea, no vomiting, no diarrhea and no muscle weakness. There has been no  fever.    Review of Systems  Constitutional: Negative for chills, fever and malaise/fatigue.  HENT: Negative for drooling, ear discharge, ear pain and sore throat.   Eyes: Negative for blurred vision and visual disturbance.  Respiratory: Negative for cough, shortness of breath and wheezing.   Cardiovascular: Negative for chest pain, palpitations, orthopnea, leg swelling and PND.  Gastrointestinal: Negative for abdominal pain, blood in stool, constipation, diarrhea, nausea and vomiting.  Endocrine: Negative for polydipsia.  Genitourinary: Negative for dysuria, frequency, hematuria and urgency.  Musculoskeletal: Negative for back pain, myalgias and neck pain.  Skin: Negative for rash.  Allergic/Immunologic: Negative for environmental allergies.  Neurological: Positive for seizures. Negative for dizziness, focal weakness, speech difficulty and headaches.  Hematological: Does not bruise/bleed easily.  Psychiatric/Behavioral: Negative for confusion and suicidal ideas. The patient is not nervous/anxious.     Patient Active Problem List   Diagnosis Date Noted  . Primary osteoarthritis of both knees 05/24/2016  . Hyperlipidemia 11/25/2015  . Seizure disorder (HCC) 11/25/2015  . Primary osteoarthritis of right knee 11/25/2015  . Seizure (HCC) 12/03/2014  . Hypertension 11/26/2014    No Known Allergies  Past Surgical History:  Procedure Laterality Date  . RIB FRACTURE SURGERY      Social History   Tobacco Use  . Smoking status: Never Smoker  . Smokeless tobacco: Never Used  Substance Use Topics  . Alcohol use: No    Alcohol/week: 0.0 standard drinks  . Drug use: No     Medication list has been reviewed and updated.  Current Meds  Medication Sig  . carbamazepine (TEGRETOL) 200 MG tablet Take 2 tablets (400 mg total) by mouth 3 (three) times daily.  . divalproex (DEPAKOTE) 500 MG DR tablet Take 1 tablet (500  mg total) by mouth 3 (three) times daily.  Marland Kitchen  lisinopril-hydrochlorothiazide (PRINZIDE,ZESTORETIC) 10-12.5 MG tablet Take 1 tablet by mouth daily.  Marland Kitchen lovastatin (MEVACOR) 40 MG tablet Take 1 tablet (40 mg total) by mouth at bedtime.    PHQ 2/9 Scores 08/08/2018 11/12/2017 05/28/2017 03/15/2017  PHQ - 2 Score 0 0 0 3  PHQ- 9 Score 0 0 - 11    Physical Exam Vitals signs and nursing note reviewed.  Constitutional:      Appearance: He is normal weight.  HENT:     Head: Normocephalic.     Right Ear: Tympanic membrane, ear canal and external ear normal.     Left Ear: Tympanic membrane, ear canal and external ear normal.     Nose: Nose normal.  Eyes:     General: No scleral icterus.       Right eye: No discharge.        Left eye: No discharge.     Conjunctiva/sclera: Conjunctivae normal.     Pupils: Pupils are equal, round, and reactive to light.  Neck:     Musculoskeletal: Normal range of motion and neck supple.     Thyroid: No thyromegaly.     Vascular: No JVD.     Trachea: No tracheal deviation.  Cardiovascular:     Rate and Rhythm: Normal rate and regular rhythm.     Pulses: Normal pulses.     Heart sounds: Normal heart sounds. No murmur. No friction rub. No gallop.   Pulmonary:     Effort: No respiratory distress.     Breath sounds: Normal breath sounds. No wheezing, rhonchi or rales.  Abdominal:     General: Bowel sounds are normal.     Palpations: Abdomen is soft. There is no mass.     Tenderness: There is no abdominal tenderness. There is no guarding or rebound.  Musculoskeletal: Normal range of motion.        General: No tenderness.  Lymphadenopathy:     Cervical: No cervical adenopathy.  Skin:    General: Skin is warm.     Capillary Refill: Capillary refill takes less than 2 seconds.     Findings: No rash.  Neurological:     Mental Status: He is alert and oriented to person, place, and time.     Cranial Nerves: No cranial nerve deficit.     Deep Tendon Reflexes: Reflexes are normal and symmetric.     BP  132/80   Pulse 72   Ht 5\' 11"  (1.803 m)   Wt 210 lb (95.3 kg)   BMI 29.29 kg/m   Assessment and Plan:  1. Seizure disorder (HCC) She has a history of seizure disorders which is currently under control with Tegretol 200 mg 2 tablets 3 times a day and Depakote 500 mg 1 tablet 3 times a day.  We will check Tegretol and Depakote levels today. - carbamazepine (TEGRETOL) 200 MG tablet; Take 2 tablets (400 mg total) by mouth 3 (three) times daily.  Dispense: 540 tablet; Refill: 1 - divalproex (DEPAKOTE) 500 MG DR tablet; Take 1 tablet (500 mg total) by mouth 3 (three) times daily.  Dispense: 270 tablet; Refill: 1 - Carbamazepine Level (Tegretol), total - Valproic Acid level  2. Essential hypertension Chronic.  Controlled.  Continue lisinopril hydrochlorothiazide 10-12 0.5 and check a renal function panel. - lisinopril-hydrochlorothiazide (PRINZIDE,ZESTORETIC) 10-12.5 MG tablet; Take 1 tablet by mouth daily.  Dispense: 90 tablet; Refill: 1 - Renal Function Panel  3. Other hyperlipidemia Patient  has a history of hyperlipidemia we will continue lovastatin 40 mg once a day - lovastatin (MEVACOR) 40 MG tablet; Take 1 tablet (40 mg total) by mouth at bedtime.  Dispense: 90 tablet; Refill: 1  4. Taking medication for chronic disease Check hepatic function panel because of the antiseizure medications that patient is currently on - Hepatic function panel  5. Arthritis Will refill etodolac Lodine 500 mg twice a day as needed for arthritis - etodolac (LODINE) 500 MG tablet; Take 1 tablet (500 mg total) by mouth 2 (two) times daily.  Dispense: 180 tablet; Refill: 1

## 2018-08-09 LAB — RENAL FUNCTION PANEL
ALBUMIN: 4.5 g/dL (ref 3.8–4.9)
BUN/Creatinine Ratio: 21 — ABNORMAL HIGH (ref 9–20)
BUN: 17 mg/dL (ref 6–24)
CHLORIDE: 96 mmol/L (ref 96–106)
CO2: 26 mmol/L (ref 20–29)
Calcium: 9 mg/dL (ref 8.7–10.2)
Creatinine, Ser: 0.82 mg/dL (ref 0.76–1.27)
GFR calc non Af Amer: 100 mL/min/{1.73_m2} (ref >59)
GFR, EST AFRICAN AMERICAN: 115 mL/min/{1.73_m2} (ref >59)
GLUCOSE: 97 mg/dL (ref 65–99)
PHOSPHORUS: 3.3 mg/dL (ref 2.8–4.1)
POTASSIUM: 4.3 mmol/L (ref 3.5–5.2)
SODIUM: 138 mmol/L (ref 134–144)

## 2018-08-09 LAB — HEPATIC FUNCTION PANEL
ALT: 9 IU/L (ref 0–44)
AST: 14 IU/L (ref 0–40)
Alkaline Phosphatase: 41 IU/L (ref 39–117)
BILIRUBIN TOTAL: 0.2 mg/dL (ref 0.0–1.2)
Bilirubin, Direct: 0.08 mg/dL (ref 0.00–0.40)
TOTAL PROTEIN: 6.4 g/dL (ref 6.0–8.5)

## 2018-08-09 LAB — CARBAMAZEPINE LEVEL, TOTAL: Carbamazepine (Tegretol), S: 10.7 ug/mL (ref 4.0–12.0)

## 2018-08-09 LAB — VALPROIC ACID LEVEL: VALPROIC ACID LVL: 66 ug/mL (ref 50–100)

## 2018-11-22 DIAGNOSIS — H40003 Preglaucoma, unspecified, bilateral: Secondary | ICD-10-CM | POA: Diagnosis not present

## 2019-01-31 ENCOUNTER — Ambulatory Visit (INDEPENDENT_AMBULATORY_CARE_PROVIDER_SITE_OTHER): Payer: Medicare PPO | Admitting: Family Medicine

## 2019-01-31 ENCOUNTER — Other Ambulatory Visit: Payer: Self-pay

## 2019-01-31 ENCOUNTER — Encounter: Payer: Self-pay | Admitting: Family Medicine

## 2019-01-31 VITALS — BP 122/82 | HR 72 | Ht 71.0 in | Wt 215.0 lb

## 2019-01-31 DIAGNOSIS — R69 Illness, unspecified: Secondary | ICD-10-CM | POA: Diagnosis not present

## 2019-01-31 DIAGNOSIS — G40909 Epilepsy, unspecified, not intractable, without status epilepticus: Secondary | ICD-10-CM | POA: Diagnosis not present

## 2019-01-31 DIAGNOSIS — M1990 Unspecified osteoarthritis, unspecified site: Secondary | ICD-10-CM | POA: Diagnosis not present

## 2019-01-31 DIAGNOSIS — I10 Essential (primary) hypertension: Secondary | ICD-10-CM | POA: Diagnosis not present

## 2019-01-31 DIAGNOSIS — E7849 Other hyperlipidemia: Secondary | ICD-10-CM | POA: Diagnosis not present

## 2019-01-31 MED ORDER — LOVASTATIN 40 MG PO TABS: 40.0000 mg | ORAL_TABLET | Freq: Every day | ORAL | 1 refills | Status: DC

## 2019-01-31 MED ORDER — LISINOPRIL-HYDROCHLOROTHIAZIDE 10-12.5 MG PO TABS: 1.0000 | ORAL_TABLET | Freq: Every day | ORAL | 1 refills | Status: DC

## 2019-01-31 MED ORDER — DIVALPROEX SODIUM 500 MG PO DR TAB: 500.0000 mg | DELAYED_RELEASE_TABLET | Freq: Three times a day (TID) | ORAL | 1 refills | Status: DC

## 2019-01-31 MED ORDER — MELOXICAM 7.5 MG PO TABS: 7.5000 mg | ORAL_TABLET | Freq: Every day | ORAL | 1 refills | Status: DC

## 2019-01-31 MED ORDER — CARBAMAZEPINE 200 MG PO TABS: 400.0000 mg | ORAL_TABLET | Freq: Three times a day (TID) | ORAL | 1 refills | Status: DC

## 2019-01-31 NOTE — Progress Notes (Signed)
Date:  01/31/2019   Name:  Jerome CraterBilly Sposito   DOB:  05/02/1961   MRN:  [id number]   Chief Complaint: Hypertension, Hyperlipidemia, Seizures, and Arthritis  Hypertension This is a chronic problem. The current episode started more than 1 year ago. The problem is unchanged. The problem is controlled. Pertinent negatives include no anxiety, blurred vision, chest pain, headaches, malaise/fatigue, neck pain, orthopnea, palpitations, peripheral edema, PND, shortness of breath or sweats. There are no associated agents to hypertension. There are no known risk factors for coronary artery disease. Past treatments include ACE inhibitors and diuretics. The current treatment provides moderate improvement. There are no compliance problems.  There is no history of angina, kidney disease, CAD/MI, CVA, heart failure, left ventricular hypertrophy, PVD or retinopathy. There is no history of chronic renal disease, a hypertension causing med or renovascular disease.  Hyperlipidemia This is a chronic problem. The current episode started more than 1 year ago. The problem is controlled. Recent lipid tests were reviewed and are normal. He has no history of chronic renal disease, diabetes, hypothyroidism, liver disease, obesity or nephrotic syndrome. Pertinent negatives include no chest pain, focal sensory loss, focal weakness, leg pain, myalgias or shortness of breath. Current antihyperlipidemic treatment includes statins. The current treatment provides moderate improvement of lipids. There are no compliance problems.  Risk factors for coronary artery disease include dyslipidemia and hypertension.  Seizures  This is a chronic problem. The problem has been gradually improving. Pertinent negatives include no sleepiness, no confusion, no headaches, no speech difficulty, no visual disturbance, no neck stiffness, no sore throat, no chest pain, no cough, no nausea, no vomiting, no diarrhea and no muscle weakness.  Arthritis Presents  for follow-up visit. He reports no pain, stiffness, joint swelling or joint warmth. The symptoms have been stable. Affected locations include the right knee. Pertinent negatives include no diarrhea, dry eyes, dry mouth, dysuria, fatigue, fever, pain at night, pain while resting, rash, Raynaud's syndrome, uveitis or weight loss.    Review of Systems  Constitutional: Negative for chills, fatigue, fever, malaise/fatigue and weight loss.  HENT: Negative for drooling, ear discharge, ear pain, postnasal drip, rhinorrhea and sore throat.   Eyes: Negative for blurred vision and visual disturbance.  Respiratory: Negative for cough, shortness of breath and wheezing.   Cardiovascular: Negative for chest pain, palpitations, orthopnea, leg swelling and PND.  Gastrointestinal: Negative for abdominal pain, blood in stool, constipation, diarrhea, nausea and vomiting.  Endocrine: Negative for polydipsia.  Genitourinary: Negative for dysuria, frequency, hematuria and urgency.  Musculoskeletal: Positive for arthritis. Negative for back pain, joint swelling, myalgias, neck pain and stiffness.  Skin: Negative for rash.  Allergic/Immunologic: Negative for environmental allergies.  Neurological: Positive for seizures. Negative for dizziness, focal weakness, speech difficulty and headaches.  Hematological: Does not bruise/bleed easily.  Psychiatric/Behavioral: Negative for confusion and suicidal ideas. The patient is not nervous/anxious.     Patient Active Problem List   Diagnosis Date Noted  . Primary osteoarthritis of both knees 05/24/2016  . Hyperlipidemia 11/25/2015  . Seizure disorder (HCC) 11/25/2015  . Primary osteoarthritis of right knee 11/25/2015  . Seizure (HCC) 12/03/2014  . Hypertension 11/26/2014    No Known Allergies  Past Surgical History:  Procedure Laterality Date  . RIB FRACTURE SURGERY      Social History   Tobacco Use  . Smoking status: Never Smoker  . Smokeless tobacco: Never  Used  Substance Use Topics  . Alcohol use: No    Alcohol/week: 0.0 standard drinks  .  Drug use: No     Medication list has been reviewed and updated.  Current Meds  Medication Sig  . carbamazepine (TEGRETOL) 200 MG tablet Take 2 tablets (400 mg total) by mouth 3 (three) times daily.  . divalproex (DEPAKOTE) 500 MG DR tablet Take 1 tablet (500 mg total) by mouth 3 (three) times daily.  Marland Kitchen. lisinopril-hydrochlorothiazide (PRINZIDE,ZESTORETIC) 10-12.5 MG tablet Take 1 tablet by mouth daily.  Marland Kitchen. lovastatin (MEVACOR) 40 MG tablet Take 1 tablet (40 mg total) by mouth at bedtime.  . meloxicam (MOBIC) 7.5 MG tablet Take 7.5 mg by mouth daily. 1 tablet daily    PHQ 2/9 Scores 01/31/2019 08/08/2018 11/12/2017 05/28/2017  PHQ - 2 Score 0 0 0 0  PHQ- 9 Score 0 0 0 -    BP Readings from Last 3 Encounters:  01/31/19 122/82  08/08/18 132/80  04/07/18 113/67    Physical Exam Vitals signs and nursing note reviewed.  HENT:     Head: Normocephalic.     Right Ear: Tympanic membrane, ear canal and external ear normal.     Left Ear: Tympanic membrane, ear canal and external ear normal.     Nose: Nose normal.  Eyes:     General: No scleral icterus.       Right eye: No discharge.        Left eye: No discharge.     Conjunctiva/sclera: Conjunctivae normal.     Pupils: Pupils are equal, round, and reactive to light.  Neck:     Musculoskeletal: Normal range of motion and neck supple.     Thyroid: No thyromegaly.     Vascular: No JVD.     Trachea: No tracheal deviation.  Cardiovascular:     Rate and Rhythm: Normal rate and regular rhythm.     Chest Wall: PMI is not displaced. No thrill.     Pulses: Normal pulses.          Carotid pulses are 2+ on the right side and 2+ on the left side.      Radial pulses are 2+ on the right side and 2+ on the left side.       Femoral pulses are 2+ on the right side and 2+ on the left side.      Popliteal pulses are 2+ on the right side and 2+ on the left side.        Dorsalis pedis pulses are 2+ on the right side and 2+ on the left side.       Posterior tibial pulses are 2+ on the right side and 2+ on the left side.     Heart sounds: Normal heart sounds, S1 normal and S2 normal. Heart sounds not distant. No murmur. No systolic murmur. No diastolic murmur. No friction rub. No gallop. No S3 or S4 sounds.   Pulmonary:     Effort: No respiratory distress.     Breath sounds: Normal breath sounds. No wheezing or rales.  Abdominal:     General: Bowel sounds are normal.     Palpations: Abdomen is soft. There is no mass.     Tenderness: There is no abdominal tenderness. There is no guarding or rebound.  Musculoskeletal: Normal range of motion.        General: No tenderness.     Right lower leg: No edema.     Left lower leg: No edema.  Lymphadenopathy:     Cervical: No cervical adenopathy.  Skin:    General: Skin is warm.  Findings: No rash.  Neurological:     Mental Status: He is alert and oriented to person, place, and time.     Cranial Nerves: No cranial nerve deficit.     Deep Tendon Reflexes: Reflexes are normal and symmetric.     Wt Readings from Last 3 Encounters:  01/31/19 215 lb (97.5 kg)  08/08/18 210 lb (95.3 kg)  04/07/18 225 lb (102.1 kg)    BP 122/82   Pulse 72   Ht 5\' 11"  (1.803 m)   Wt 215 lb (97.5 kg)   SpO2 97%   BMI 29.99 kg/m   Assessment and Plan:  1. Seizure disorder [facility]) Patient with seizure disorder.  Chronic.  Controlled.  Will continue Tegretol 200 mg 2 tablets 3 times a day.  Will check Tegretol level.  We will also continue Depakote 500 mg 3 times a day and will also check a Depakote level.- carbamazepine (TEGRETOL) 200 MG tablet; Take 2 tablets (400 mg total) by mouth 3 (three) times daily.  Dispense: 540 tablet; Refill: 1 - divalproex (DEPAKOTE) 500 MG DR tablet; Take 1 tablet (500 mg total) by mouth 3 (three) times daily.  Dispense: 270 tablet; Refill: 1 - Valproic Acid level - Carbamazepine Level  (Tegretol), total  2. Essential hypertension Chronic.  Controlled.  Continue lisinopril hydrochlorothiazide 10-12.5 mg once a day and will check renal function panel. - Renal Function Panel - lisinopril-hydrochlorothiazide (ZESTORETIC) 10-12.5 MG tablet; Take 1 tablet by mouth daily.  Dispense: 90 tablet; Refill: 1  3. Other hyperlipidemia Chronic.  Controlled.  Continue lovastatin 40 mg once a day will check lipid panel with ratio. - Lipid Panel With LDL/HDL Ratio - lovastatin (MEVACOR) 40 MG tablet; Take 1 tablet (40 mg total) by mouth at bedtime.  Dispense: 90 tablet; Refill: 1  4. Taking medication for chronic disease Patient on chronic medications and will check CBC valproic acid level carbamazepine level and hepatic function level for toxicity. - CBC w/Diff/Platelet - Valproic Acid level - Carbamazepine Level (Tegretol), total - Hepatic Function Panel (6)  5. Arthritis Patient has increasing right knee pain presumably from osteoarthritis.  Will continue NSAID however insurance is dictated that we changed to meloxicam 7.5 mg daily.  Will check GFR by renal panel to assess NSAID tolerability. - meloxicam (MOBIC) 7.5 MG tablet; Take 1 tablet (7.5 mg total) by mouth daily. 1 tablet daily  Dispense: 90 tablet; Refill: 1

## 2019-02-01 LAB — CBC WITH DIFFERENTIAL/PLATELET
Basophils Absolute: 0.1 10*3/uL (ref 0.0–0.2)
Basos: 1 %
EOS (ABSOLUTE): 0.6 10*3/uL — ABNORMAL HIGH (ref 0.0–0.4)
Eos: 12 %
Hematocrit: 38.2 % (ref 37.5–51.0)
Hemoglobin: 13.1 g/dL (ref 13.0–17.7)
Immature Grans (Abs): 0 10*3/uL (ref 0.0–0.1)
Immature Granulocytes: 0 %
Lymphocytes Absolute: 1.9 10*3/uL (ref 0.7–3.1)
Lymphs: 38 %
MCH: 31.5 pg (ref 26.6–33.0)
MCHC: 34.3 g/dL (ref 31.5–35.7)
MCV: 92 fL (ref 79–97)
Monocytes Absolute: 0.7 10*3/uL (ref 0.1–0.9)
Monocytes: 14 %
Neutrophils Absolute: 1.8 10*3/uL (ref 1.4–7.0)
Neutrophils: 35 %
Platelets: 194 10*3/uL (ref 150–450)
RBC: 4.16 x10E6/uL (ref 4.14–5.80)
RDW: 12.6 % (ref 11.6–15.4)
WBC: 5 10*3/uL (ref 3.4–10.8)

## 2019-02-01 LAB — RENAL FUNCTION PANEL
Albumin: 4.5 g/dL (ref 3.8–4.9)
BUN/Creatinine Ratio: 21 — ABNORMAL HIGH (ref 9–20)
BUN: 19 mg/dL (ref 6–24)
CO2: 28 mmol/L (ref 20–29)
Calcium: 8.9 mg/dL (ref 8.7–10.2)
Chloride: 95 mmol/L — ABNORMAL LOW (ref 96–106)
Creatinine, Ser: 0.9 mg/dL (ref 0.76–1.27)
GFR calc Af Amer: 110 mL/min/{1.73_m2} (ref >59)
GFR calc non Af Amer: 95 mL/min/{1.73_m2} (ref >59)
Glucose: 88 mg/dL (ref 65–99)
Phosphorus: 3.7 mg/dL (ref 2.8–4.1)
Potassium: 5.3 mmol/L — ABNORMAL HIGH (ref 3.5–5.2)
Sodium: 135 mmol/L (ref 134–144)

## 2019-02-01 LAB — LIPID PANEL WITH LDL/HDL RATIO
Cholesterol, Total: 214 mg/dL — ABNORMAL HIGH (ref 100–199)
HDL: 76 mg/dL (ref >39)
LDL Calculated: 101 mg/dL — ABNORMAL HIGH (ref 0–99)
LDl/HDL Ratio: 1.3 ratio (ref 0.0–3.6)
Triglycerides: 183 mg/dL — ABNORMAL HIGH (ref 0–149)
VLDL Cholesterol Cal: 37 mg/dL (ref 5–40)

## 2019-02-01 LAB — CARBAMAZEPINE LEVEL, TOTAL: Carbamazepine (Tegretol), S: 11.3 ug/mL (ref 4.0–12.0)

## 2019-02-01 LAB — HEPATIC FUNCTION PANEL (6)
ALT: 8 IU/L (ref 0–44)
AST: 13 IU/L (ref 0–40)
Alkaline Phosphatase: 41 IU/L (ref 39–117)
Bilirubin Total: 0.2 mg/dL (ref 0.0–1.2)
Bilirubin, Direct: 0.1 mg/dL (ref 0.00–0.40)

## 2019-02-01 LAB — VALPROIC ACID LEVEL: Valproic Acid Lvl: 71 ug/mL (ref 50–100)

## 2019-02-06 DIAGNOSIS — M1711 Unilateral primary osteoarthritis, right knee: Secondary | ICD-10-CM | POA: Diagnosis not present

## 2019-02-06 DIAGNOSIS — F324 Major depressive disorder, single episode, in partial remission: Secondary | ICD-10-CM | POA: Diagnosis not present

## 2019-02-06 DIAGNOSIS — Z87820 Personal history of traumatic brain injury: Secondary | ICD-10-CM | POA: Diagnosis not present

## 2019-02-06 DIAGNOSIS — G40909 Epilepsy, unspecified, not intractable, without status epilepticus: Secondary | ICD-10-CM | POA: Diagnosis not present

## 2019-02-06 DIAGNOSIS — R413 Other amnesia: Secondary | ICD-10-CM | POA: Diagnosis not present

## 2019-02-06 DIAGNOSIS — M545 Low back pain: Secondary | ICD-10-CM | POA: Diagnosis not present

## 2019-02-06 DIAGNOSIS — Z6829 Body mass index (BMI) 29.0-29.9, adult: Secondary | ICD-10-CM | POA: Diagnosis not present

## 2019-02-06 DIAGNOSIS — I10 Essential (primary) hypertension: Secondary | ICD-10-CM | POA: Diagnosis not present

## 2019-02-06 DIAGNOSIS — E785 Hyperlipidemia, unspecified: Secondary | ICD-10-CM | POA: Diagnosis not present

## 2019-07-11 ENCOUNTER — Ambulatory Visit (INDEPENDENT_AMBULATORY_CARE_PROVIDER_SITE_OTHER): Payer: Medicare PPO | Admitting: Family Medicine

## 2019-07-11 ENCOUNTER — Encounter: Payer: Self-pay | Admitting: Family Medicine

## 2019-07-11 ENCOUNTER — Other Ambulatory Visit: Payer: Self-pay

## 2019-07-11 VITALS — BP 128/80 | HR 68 | Ht 71.0 in | Wt 213.0 lb

## 2019-07-11 DIAGNOSIS — I10 Essential (primary) hypertension: Secondary | ICD-10-CM | POA: Diagnosis not present

## 2019-07-11 DIAGNOSIS — R69 Illness, unspecified: Secondary | ICD-10-CM

## 2019-07-11 DIAGNOSIS — E7849 Other hyperlipidemia: Secondary | ICD-10-CM | POA: Diagnosis not present

## 2019-07-11 DIAGNOSIS — G40909 Epilepsy, unspecified, not intractable, without status epilepticus: Secondary | ICD-10-CM

## 2019-07-11 DIAGNOSIS — M1990 Unspecified osteoarthritis, unspecified site: Secondary | ICD-10-CM | POA: Diagnosis not present

## 2019-07-11 MED ORDER — DIVALPROEX SODIUM 500 MG PO DR TAB: 500.0000 mg | DELAYED_RELEASE_TABLET | Freq: Three times a day (TID) | ORAL | 1 refills | Status: DC

## 2019-07-11 MED ORDER — CARBAMAZEPINE 200 MG PO TABS: 400.0000 mg | ORAL_TABLET | Freq: Three times a day (TID) | ORAL | 1 refills | Status: DC

## 2019-07-11 MED ORDER — LOVASTATIN 40 MG PO TABS: 40.0000 mg | ORAL_TABLET | Freq: Every day | ORAL | 1 refills | Status: DC

## 2019-07-11 MED ORDER — MELOXICAM 7.5 MG PO TABS: 7.5000 mg | ORAL_TABLET | Freq: Every day | ORAL | 1 refills | Status: DC

## 2019-07-11 MED ORDER — LISINOPRIL-HYDROCHLOROTHIAZIDE 10-12.5 MG PO TABS: 1.0000 | ORAL_TABLET | Freq: Every day | ORAL | 1 refills | Status: DC

## 2019-07-11 NOTE — Patient Instructions (Signed)

## 2019-07-11 NOTE — Progress Notes (Signed)
Date:  07/11/2019   Name:  Jerome Gilmore   DOB:  1960/06/19   MRN:  [id number]   Chief Complaint: Hypertension, Hyperlipidemia, Leg Pain (takes meloxicam), and Seizures  Hypertension This is a chronic problem. The current episode started more than 1 year ago. The problem has been gradually improving since onset. The problem is controlled. Pertinent negatives include no anxiety, blurred vision, chest pain, headaches, malaise/fatigue, neck pain, orthopnea, palpitations, peripheral edema, PND, shortness of breath or sweats. There are no associated agents to hypertension. Risk factors for coronary artery disease include male gender and dyslipidemia. Past treatments include ACE inhibitors and diuretics. The current treatment provides moderate improvement. There are no compliance problems.  There is no history of angina, kidney disease, CAD/MI, CVA, heart failure, left ventricular hypertrophy or PVD. There is no history of chronic renal disease, a hypertension causing med or renovascular disease.  Hyperlipidemia This is a chronic problem. The current episode started more than 1 year ago. Recent lipid tests were reviewed and are normal. He has no history of chronic renal disease, diabetes, hypothyroidism, liver disease, obesity or nephrotic syndrome. There are no known factors aggravating his hyperlipidemia. Associated symptoms include leg pain. Pertinent negatives include no chest pain, focal sensory loss, focal weakness, myalgias or shortness of breath. Current antihyperlipidemic treatment includes statins. The current treatment provides moderate improvement of lipids. There are no compliance problems.  Risk factors for coronary artery disease include dyslipidemia, hypertension and male sex.  Leg Pain  There was no injury mechanism. The pain is present in the right knee. The quality of the pain is described as aching. The pain has been intermittent since onset. Pertinent negatives include no inability to  bear weight, loss of motion, loss of sensation, muscle weakness, numbness or tingling.  Seizures  This is a chronic problem. Progression since onset: controlled. Pertinent negatives include no sleepiness, no confusion, no headaches, no visual disturbance, no neck stiffness, no sore throat, no chest pain, no cough, no nausea, no vomiting, no diarrhea and no muscle weakness.    Lab Results  Component Value Date   CREATININE 0.90 01/31/2019   BUN 19 01/31/2019   NA 135 01/31/2019   K 5.3 (H) 01/31/2019   CL 95 (L) 01/31/2019   CO2 28 01/31/2019   Lab Results  Component Value Date   CHOL 214 (H) 01/31/2019   HDL 76 01/31/2019   LDLCALC 101 (H) 01/31/2019   TRIG 183 (H) 01/31/2019   CHOLHDL 2.8 02/20/2018   No results found for: TSH No results found for: HGBA1C   Review of Systems  Constitutional: Negative for chills, fever and malaise/fatigue.  HENT: Negative for drooling, ear discharge, ear pain and sore throat.   Eyes: Negative for blurred vision and visual disturbance.  Respiratory: Negative for cough, shortness of breath and wheezing.   Cardiovascular: Negative for chest pain, palpitations, orthopnea, leg swelling and PND.  Gastrointestinal: Negative for abdominal pain, blood in stool, constipation, diarrhea, nausea and vomiting.  Endocrine: Negative for polydipsia.  Genitourinary: Negative for dysuria, frequency, hematuria and urgency.  Musculoskeletal: Negative for back pain, myalgias and neck pain.  Skin: Negative for rash.  Allergic/Immunologic: Negative for environmental allergies.  Neurological: Positive for seizures. Negative for dizziness, tingling, focal weakness, numbness and headaches.  Hematological: Does not bruise/bleed easily.  Psychiatric/Behavioral: Negative for confusion and suicidal ideas. The patient is not nervous/anxious.     Patient Active Problem List   Diagnosis Date Noted  . Primary osteoarthritis of both knees  05/24/2016  . Hyperlipidemia  11/25/2015  . Seizure disorder (HCC) 11/25/2015  . Primary osteoarthritis of right knee 11/25/2015  . Seizure (HCC) 12/03/2014  . Hypertension 11/26/2014    No Known Allergies  Past Surgical History:  Procedure Laterality Date  . RIB FRACTURE SURGERY      Social History   Tobacco Use  . Smoking status: Never Smoker  . Smokeless tobacco: Never Used  Substance Use Topics  . Alcohol use: No    Alcohol/week: 0.0 standard drinks  . Drug use: No     Medication list has been reviewed and updated.  Current Meds  Medication Sig  . carbamazepine (TEGRETOL) 200 MG tablet Take 2 tablets (400 mg total) by mouth 3 (three) times daily.  . divalproex (DEPAKOTE) 500 MG DR tablet Take 1 tablet (500 mg total) by mouth 3 (three) times daily.  Marland Kitchen lisinopril-hydrochlorothiazide (ZESTORETIC) 10-12.5 MG tablet Take 1 tablet by mouth daily.  Marland Kitchen lovastatin (MEVACOR) 40 MG tablet Take 1 tablet (40 mg total) by mouth at bedtime.  . meloxicam (MOBIC) 7.5 MG tablet Take 1 tablet (7.5 mg total) by mouth daily. 1 tablet daily    PHQ 2/9 Scores 07/11/2019 01/31/2019 08/08/2018 11/12/2017  PHQ - 2 Score 0 0 0 0  PHQ- 9 Score 0 0 0 0    BP Readings from Last 3 Encounters:  07/11/19 128/80  01/31/19 122/82  08/08/18 132/80    Physical Exam Vitals and nursing note reviewed.  HENT:     Head: Normocephalic.     Right Ear: External ear normal.     Left Ear: External ear normal.     Nose: Nose normal.  Eyes:     General: No scleral icterus.       Right eye: No discharge.        Left eye: No discharge.     Conjunctiva/sclera: Conjunctivae normal.     Pupils: Pupils are equal, round, and reactive to light.  Neck:     Thyroid: No thyromegaly.     Vascular: No JVD.     Trachea: No tracheal deviation.  Cardiovascular:     Rate and Rhythm: Normal rate and regular rhythm.     Heart sounds: Normal heart sounds. No murmur. No friction rub. No gallop.   Pulmonary:     Effort: No respiratory distress.      Breath sounds: Normal breath sounds. No wheezing or rales.  Abdominal:     General: Bowel sounds are normal.     Palpations: Abdomen is soft. There is no mass.     Tenderness: There is no abdominal tenderness. There is no guarding or rebound.  Musculoskeletal:        General: No tenderness. Normal range of motion.     Cervical back: Normal range of motion and neck supple.  Lymphadenopathy:     Cervical: No cervical adenopathy.  Skin:    General: Skin is warm.     Findings: No rash.  Neurological:     Mental Status: He is alert and oriented to person, place, and time.     Cranial Nerves: No cranial nerve deficit.     Deep Tendon Reflexes: Reflexes are normal and symmetric.     Wt Readings from Last 3 Encounters:  07/11/19 213 lb (96.6 kg)  01/31/19 215 lb (97.5 kg)  08/08/18 210 lb (95.3 kg)    BP 128/80   Pulse 68   Ht 5\' 11"  (1.803 m)   Wt 213 lb (96.6 kg)  BMI 29.71 kg/m   Assessment and Plan:  1. Essential hypertension Chronic.  Controlled.  Stable.  Continue lisinopril hydrochlorothiazide 10-12 0.5 once a day.  Reviewed previous renal function of the last year is unremarkable this will be repeated in 6 months when patient returns for recheck. - lisinopril-hydrochlorothiazide (ZESTORETIC) 10-12.5 MG tablet; Take 1 tablet by mouth daily.  Dispense: 90 tablet; Refill: 1  2. Arthritis Patient is doing well with recent switch to meloxicam 7.5 mg once a day.  Patient is tolerating medication well.  We will continue at current dosing of 7.5 mg once a day. - meloxicam (MOBIC) 7.5 MG tablet; Take 1 tablet (7.5 mg total) by mouth daily. 1 tablet daily  Dispense: 90 tablet; Refill: 1  3. Other hyperlipidemia Chronic.  Controlled.  Stable.  Will continue Mevacor 40 mg once a day.  Will check lipid panel today.  For effective lipid control. - lovastatin (MEVACOR) 40 MG tablet; Take 1 tablet (40 mg total) by mouth at bedtime.  Dispense: 90 tablet; Refill: 1 - Lipid Panel With  LDL/HDL Ratio  4. Seizure disorder (HCC) Chronic.  Stable.  No seizure disorder since past visit noted.  Patient will continue Depakote 500 mg 3 times a day.  And Tegretol 200 mg 2 tablets by mouth 3 times a day. - divalproex (DEPAKOTE) 500 MG DR tablet; Take 1 tablet (500 mg total) by mouth 3 (three) times daily.  Dispense: 270 tablet; Refill: 1 - carbamazepine (TEGRETOL) 200 MG tablet; Take 2 tablets (400 mg total) by mouth 3 (three) times daily.  Dispense: 540 tablet; Refill: 1  5. Taking medication for chronic disease Patient is on seizure medications which may affect bone marrow concerns and patient will have CBC to verify stable hematology counts. - CBC w/Diff/Platelet

## 2019-07-12 LAB — CBC WITH DIFFERENTIAL/PLATELET
Basophils Absolute: 0.1 10*3/uL (ref 0.0–0.2)
Basos: 1 %
EOS (ABSOLUTE): 0.2 10*3/uL (ref 0.0–0.4)
Eos: 4 %
Hematocrit: 42.3 % (ref 37.5–51.0)
Hemoglobin: 14.2 g/dL (ref 13.0–17.7)
Immature Grans (Abs): 0 10*3/uL (ref 0.0–0.1)
Immature Granulocytes: 0 %
Lymphocytes Absolute: 1.6 10*3/uL (ref 0.7–3.1)
Lymphs: 32 %
MCH: 30.5 pg (ref 26.6–33.0)
MCHC: 33.6 g/dL (ref 31.5–35.7)
MCV: 91 fL (ref 79–97)
Monocytes Absolute: 0.7 10*3/uL (ref 0.1–0.9)
Monocytes: 13 %
Neutrophils Absolute: 2.5 10*3/uL (ref 1.4–7.0)
Neutrophils: 50 %
Platelets: 216 10*3/uL (ref 150–450)
RBC: 4.66 x10E6/uL (ref 4.14–5.80)
RDW: 13.1 % (ref 11.6–15.4)
WBC: 4.9 10*3/uL (ref 3.4–10.8)

## 2019-07-12 LAB — LIPID PANEL WITH LDL/HDL RATIO
Cholesterol, Total: 216 mg/dL — ABNORMAL HIGH (ref 100–199)
HDL: 83 mg/dL (ref >39)
LDL Chol Calc (NIH): 108 mg/dL — ABNORMAL HIGH (ref 0–99)
LDL/HDL Ratio: 1.3 ratio (ref 0.0–3.6)
Triglycerides: 144 mg/dL (ref 0–149)
VLDL Cholesterol Cal: 25 mg/dL (ref 5–40)

## 2019-08-19 DIAGNOSIS — I10 Essential (primary) hypertension: Secondary | ICD-10-CM | POA: Diagnosis not present

## 2019-08-19 DIAGNOSIS — F0390 Unspecified dementia without behavioral disturbance: Secondary | ICD-10-CM | POA: Diagnosis not present

## 2019-08-19 DIAGNOSIS — G40909 Epilepsy, unspecified, not intractable, without status epilepticus: Secondary | ICD-10-CM | POA: Diagnosis not present

## 2019-08-19 DIAGNOSIS — Z6829 Body mass index (BMI) 29.0-29.9, adult: Secondary | ICD-10-CM | POA: Diagnosis not present

## 2019-08-19 DIAGNOSIS — E785 Hyperlipidemia, unspecified: Secondary | ICD-10-CM | POA: Diagnosis not present

## 2019-08-19 DIAGNOSIS — M479 Spondylosis, unspecified: Secondary | ICD-10-CM | POA: Diagnosis not present

## 2019-08-19 DIAGNOSIS — Z87820 Personal history of traumatic brain injury: Secondary | ICD-10-CM | POA: Diagnosis not present

## 2019-08-19 DIAGNOSIS — M170 Bilateral primary osteoarthritis of knee: Secondary | ICD-10-CM | POA: Diagnosis not present

## 2019-09-02 ENCOUNTER — Telehealth: Payer: Self-pay

## 2019-09-02 NOTE — Telephone Encounter (Signed)
I notified pt of his covid vax on 09/10/19 @ 10:00 on [employer]

## 2019-09-10 ENCOUNTER — Ambulatory Visit: Payer: Medicare PPO | Attending: Internal Medicine

## 2019-09-10 ENCOUNTER — Ambulatory Visit: Payer: Medicare PPO

## 2019-09-10 DIAGNOSIS — Z23 Encounter for immunization: Secondary | ICD-10-CM

## 2019-09-10 NOTE — Progress Notes (Signed)
   Covid-19 Vaccination Clinic  Name:  Padraig Nhan    MRN: [id number] DOB: [birth year]  09/10/2019  Mr. Arts was observed post Covid-19 immunization for 15 minutes without incident. He was provided with Vaccine Information Sheet and instruction to access the V-Safe system.   Mr. Schlitt was instructed to call 911 with any severe reactions post vaccine: Marland Kitchen Difficulty breathing  . Swelling of face and throat  . A fast heartbeat  . A bad rash all over body  . Dizziness and weakness   Immunizations Administered    Name Date Dose VIS Date Route   Pfizer COVID-19 Vaccine 09/10/2019  9:37 AM 0.3 mL 05/23/2019 Intramuscular   Manufacturer: [employer], [employer]   Lot: [id number]   NDC: [address]-0

## 2019-09-15 IMAGING — CR DG CHEST 2V
2 series · 2 of 2 positions shown · non-contrast
Comparison: None.

CLINICAL DATA: Lightheadedness

EXAM:
CHEST - 2 VIEW

[chest pa]
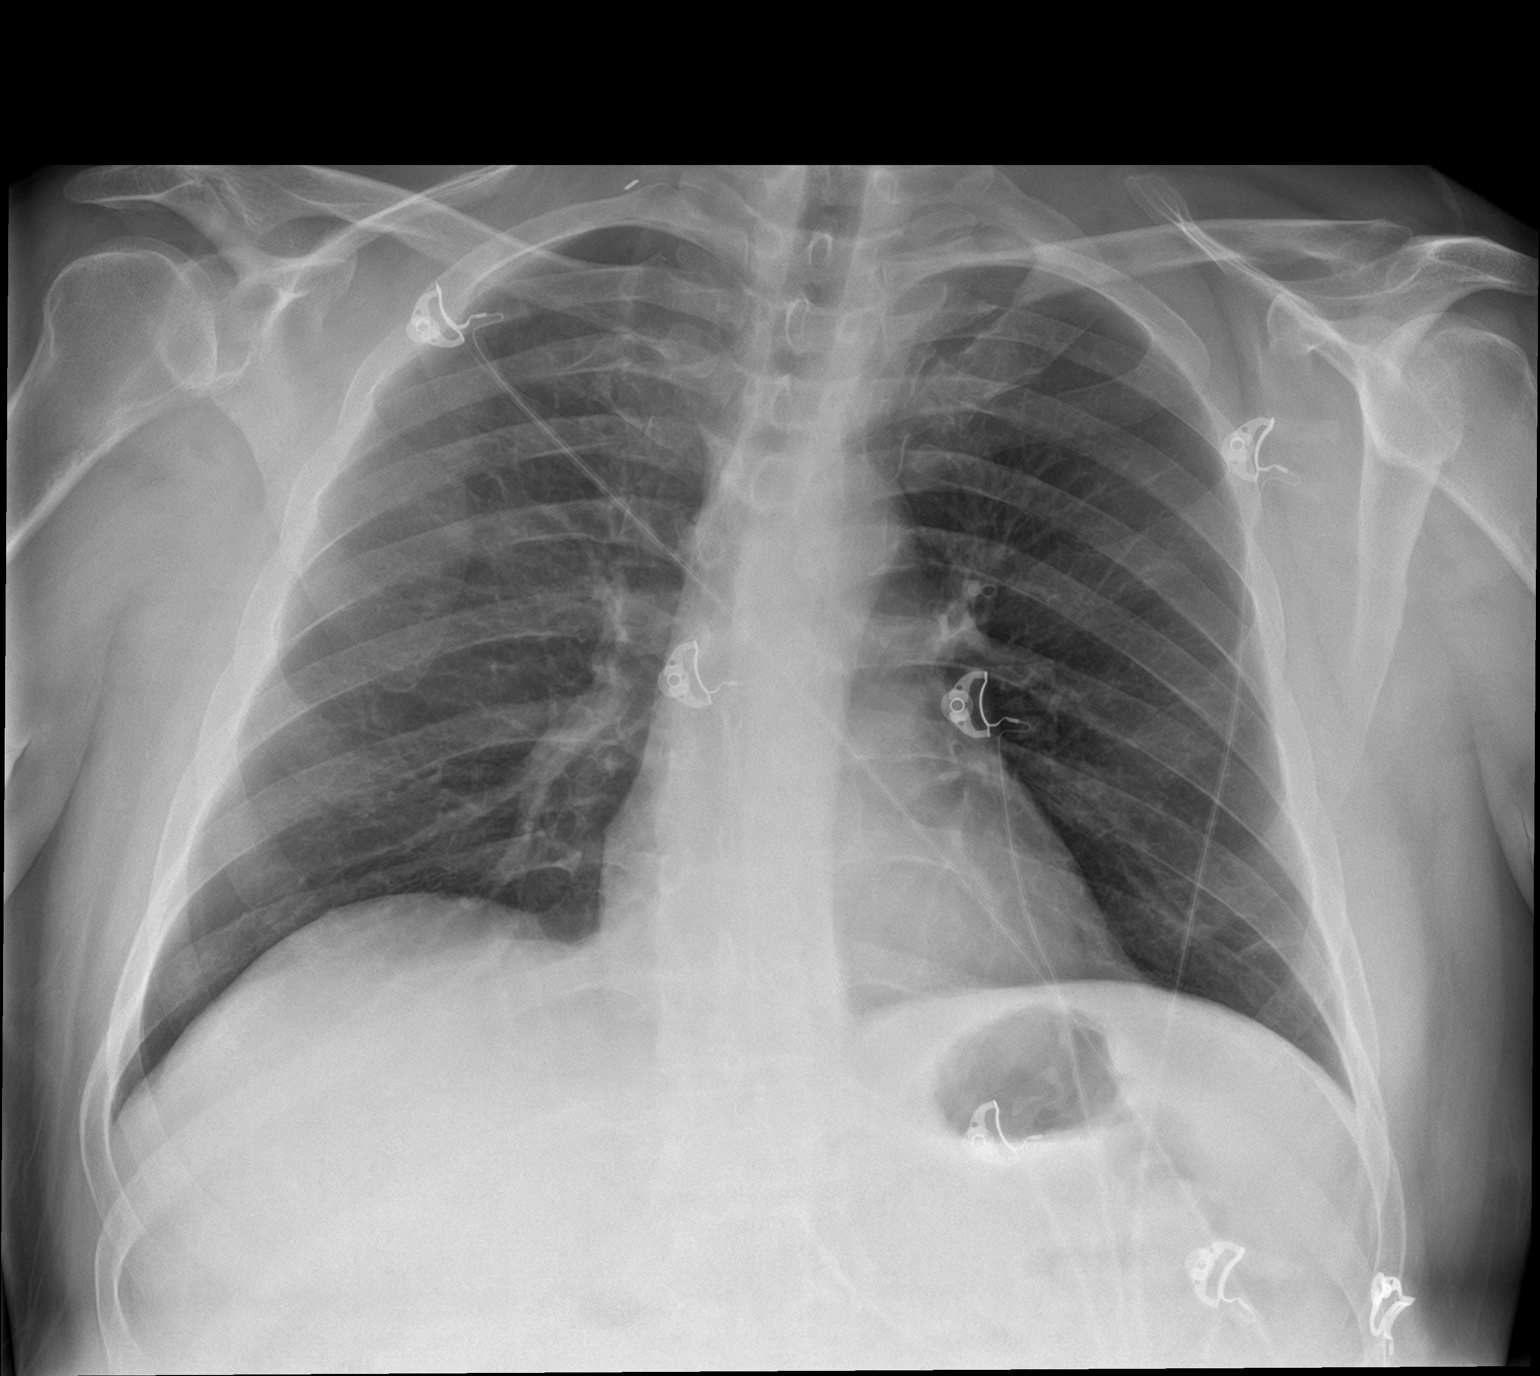

[chest lat]
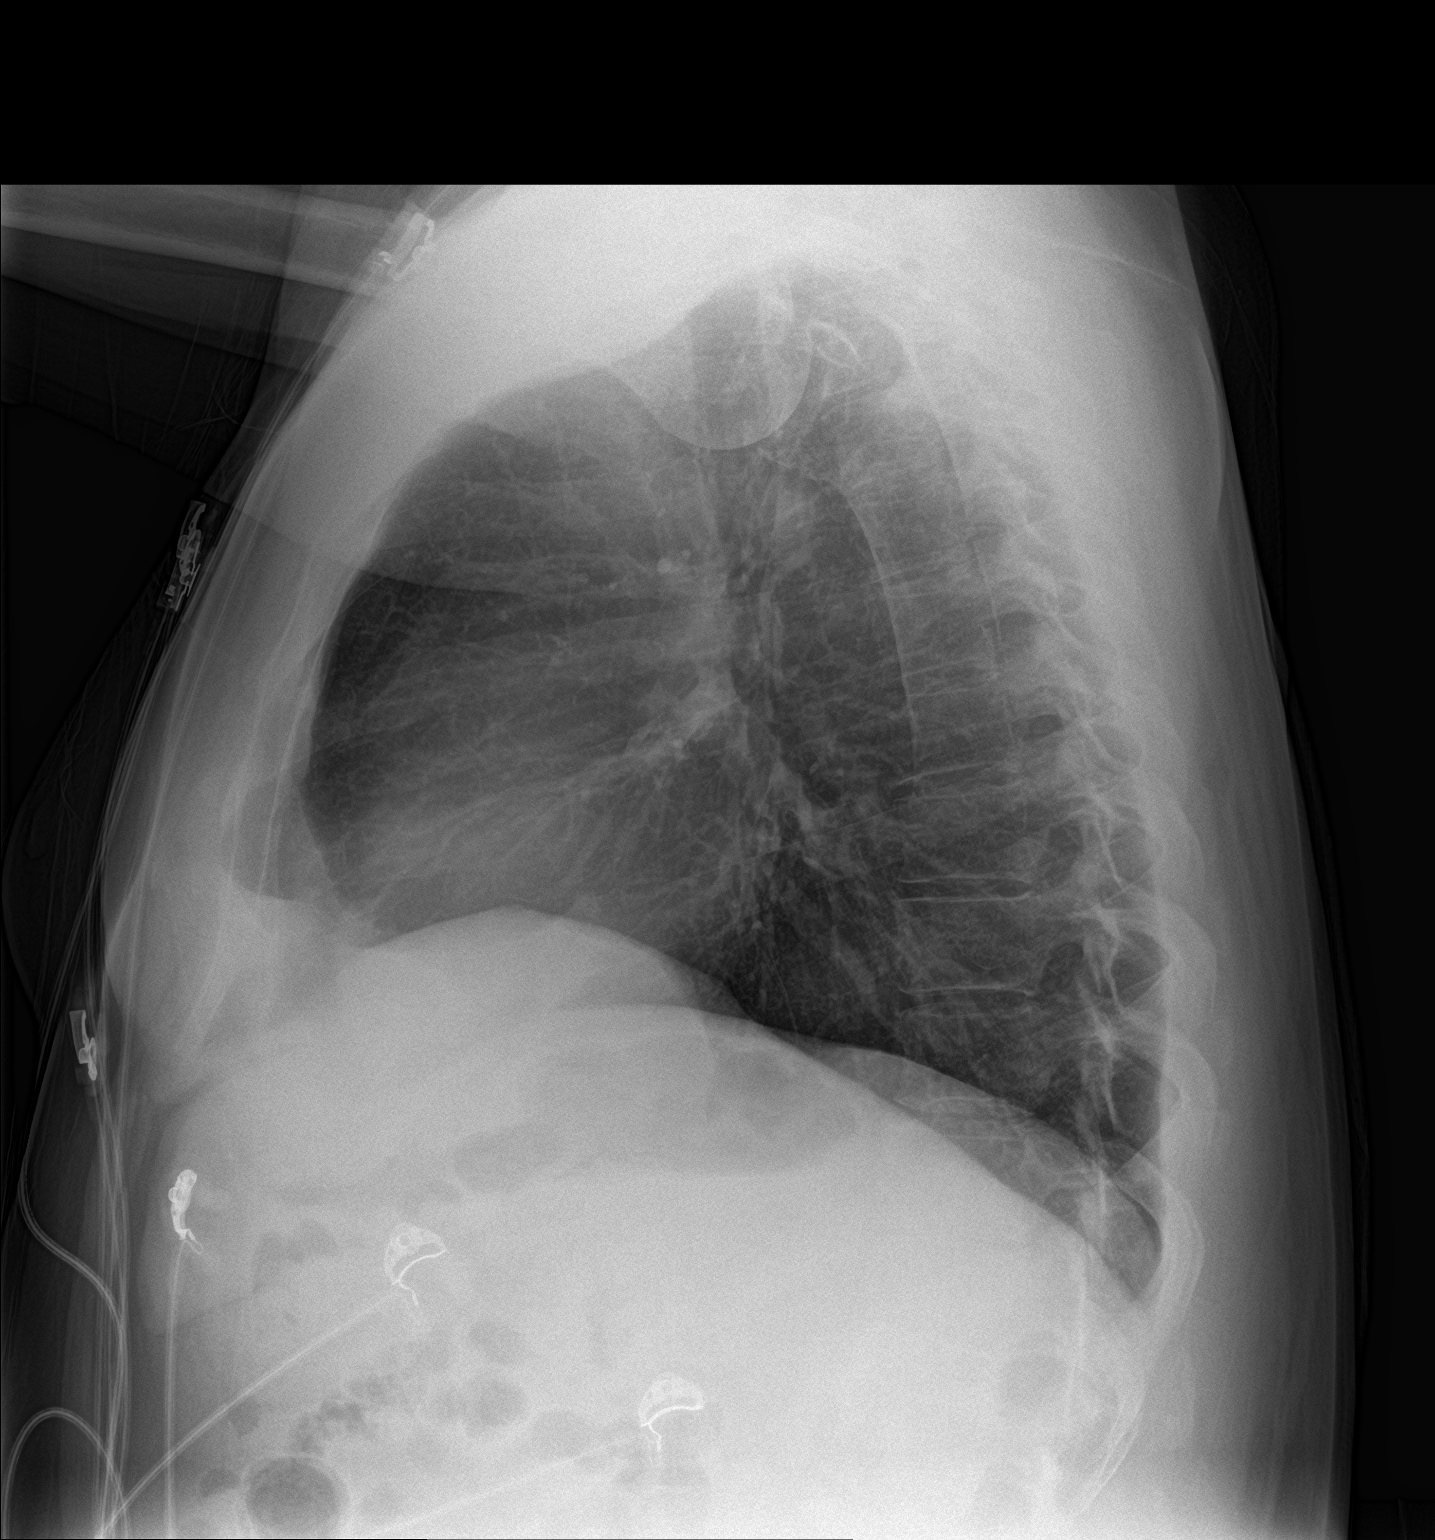

[2 of 2 positions shown; findings below may reference images not displayed]

FINDINGS: The heart size and mediastinal contours are within normal limits.
Both lungs are clear. The visualized skeletal structures are
unremarkable.
IMPRESSION: No active cardiopulmonary disease.

## 2019-10-01 ENCOUNTER — Ambulatory Visit: Payer: Medicare PPO | Attending: Internal Medicine

## 2019-10-01 DIAGNOSIS — Z23 Encounter for immunization: Secondary | ICD-10-CM

## 2019-10-01 NOTE — Progress Notes (Signed)
   Covid-19 Vaccination Clinic  Name:  Stafford Riviera    MRN: [id number] DOB: 1961-06-07  10/01/2019  Mr. Jorge was observed post Covid-19 immunization for 15 minutes without incident. He was provided with Vaccine Information Sheet and instruction to access the V-Safe system.   Mr. Mcgrady was instructed to call 911 with any severe reactions post vaccine: Marland Kitchen Difficulty breathing  . Swelling of face and throat  . A fast heartbeat  . A bad rash all over body  . Dizziness and weakness   Immunizations Administered    Name Date Dose VIS Date Route   Pfizer COVID-19 Vaccine 10/01/2019  2:00 PM 0.3 mL 08/06/2018 Intramuscular   Manufacturer: [employer], [employer]   Lot: [id number]   NDC: [address]-1

## 2019-10-08 ENCOUNTER — Ambulatory Visit (INDEPENDENT_AMBULATORY_CARE_PROVIDER_SITE_OTHER): Payer: Medicare PPO

## 2019-10-08 VITALS — Ht 71.0 in | Wt 215.0 lb

## 2019-10-08 DIAGNOSIS — Z0000 Encounter for general adult medical examination without abnormal findings: Secondary | ICD-10-CM

## 2019-10-08 NOTE — Patient Instructions (Signed)
Jerome Gilmore , Thank you for taking time to come for your Medicare Wellness Visit. I appreciate your ongoing commitment to your health goals. Please review the following plan we discussed and let me know if I can assist you in the future.   Screening recommendations/referrals: Colonoscopy: done 11/25/15 Recommended yearly ophthalmology/optometry visit for glaucoma screening and checkup Recommended yearly dental visit for hygiene and checkup  Vaccinations: Influenza vaccine: done 04/13/19 Pneumococcal vaccine: due age 57 Tdap vaccine: done 05/24/16 Shingles vaccine: Shingrix discussed. Please contact your pharmacy for coverage information.  Covid-19: done 09/10/19 & 10/01/19  Advanced directives: Advance directive discussed with you today. Even though you declined this today please call our office should you change your mind and we can give you the proper paperwork for you to fill out.  Conditions/risks identified: recommend increasing water intake and physical activity  Next appointment: Please follow up in one year for your Medicare Annual Wellness visit.    Preventive Care 40-64 Years, Male Preventive care refers to lifestyle choices and visits with your health care provider that can promote health and wellness. What does preventive care include?  A yearly physical exam. This is also called an annual well check.  Dental exams once or twice a year.  Routine eye exams. Ask your health care provider how often you should have your eyes checked.  Personal lifestyle choices, including:  Daily care of your teeth and gums.  Regular physical activity.  Eating a healthy diet.  Avoiding tobacco and drug use.  Limiting alcohol use.  Practicing safe sex.  Taking low-dose aspirin every day starting at age 50. What happens during an annual well check? The services and screenings done by your health care provider during your annual well check will depend on your age, overall health,  lifestyle risk factors, and family history of disease. Counseling  Your health care provider may ask you questions about your:  Alcohol use.  Tobacco use.  Drug use.  Emotional well-being.  Home and relationship well-being.  Sexual activity.  Eating habits.  Work and work Astron[employer]. Screening  You may have the following tests or measurements:  Height, weight, and BMI.  Blood pressure.  Lipid and cholesterol levels. These may be checked every 5 years, or more frequently if you are over 73 years old.  Skin check.  Lung cancer screening. You may have this screening every year starting at age 25 if you have a 30-pack-year history of smoking and currently smoke or have quit within the past 15 years.  Fecal occult blood test (FOBT) of the stool. You may have this test every year starting at age 1.  Flexible sigmoidoscopy or colonoscopy. You may have a sigmoidoscopy every 5 years or a colonoscopy every 10 years starting at age 57.  Prostate cancer screening. Recommendations will vary depending on your family history and other risks.  Hepatitis C blood test.  Hepatitis B blood test.  Sexually transmitted disease (STD) testing.  Diabetes screening. This is done by checking your blood sugar (glucose) after you have not eaten for a while (fasting). You may have this done every 1-3 years. Discuss your test results, treatment options, and if necessary, the need for more tests with your health care provider. Vaccines  Your health care provider may recommend certain vaccines, such as:  Influenza vaccine. This is recommended every year.  Tetanus, diphtheria, and acellular pertussis (Tdap, Td) vaccine. You may need a Td booster every 10 years.  Zoster vaccine. You may need this after  age 55.  Pneumococcal 13-valent conjugate (PCV13) vaccine. You may need this if you have certain conditions and have not been vaccinated.  Pneumococcal polysaccharide (PPSV23) vaccine. You may  need one or two doses if you smoke cigarettes or if you have certain conditions. Talk to your health care provider about which screenings and vaccines you need and how often you need them. This information is not intended to replace advice given to you by your health care provider. Make sure you discuss any questions you have with your health care provider. Document Released: 06/25/2015 Document Revised: 02/16/2016 Document Reviewed: 03/30/2015 Elsevier Interactive Patient Education  2017 [employer] Prevention in the Home Falls can cause injuries. They can happen to people of all ages. There are many things you can do to make your home safe and to help prevent falls. What can I do on the outside of my home?  Regularly fix the edges of walkways and driveways and fix any cracks.  Remove anything that might make you trip as you walk through a door, such as a raised step or threshold.  Trim any bushes or trees on the path to your home.  Use bright outdoor lighting.  Clear any walking paths of anything that might make someone trip, such as rocks or tools.  Regularly check to see if handrails are loose or broken. Make sure that both sides of any steps have handrails.  Any raised decks and porches should have guardrails on the edges.  Have any leaves, snow, or ice cleared regularly.  Use sand or salt on walking paths during winter.  Clean up any spills in your garage right away. This includes oil or grease spills. What can I do in the bathroom?  Use night lights.  Install grab bars by the toilet and in the tub and shower. Do not use towel bars as grab bars.  Use non-skid mats or decals in the tub or shower.  If you need to sit down in the shower, use a plastic, non-slip stool.  Keep the floor dry. Clean up any water that spills on the floor as soon as it happens.  Remove soap buildup in the tub or shower regularly.  Attach bath mats securely with double-sided non-slip  rug tape.  Do not have throw rugs and other things on the floor that can make you trip. What can I do in the bedroom?  Use night lights.  Make sure that you have a light by your bed that is easy to reach.  Do not use any sheets or blankets that are too big for your bed. They should not hang down onto the floor.  Have a firm chair that has side arms. You can use this for support while you get dressed.  Do not have throw rugs and other things on the floor that can make you trip. What can I do in the kitchen?  Clean up any spills right away.  Avoid walking on wet floors.  Keep items that you use a lot in easy-to-reach places.  If you need to reach something above you, use a strong step stool that has a grab bar.  Keep electrical cords out of the way.  Do not use floor polish or wax that makes floors slippery. If you must use wax, use non-skid floor wax.  Do not have throw rugs and other things on the floor that can make you trip. What can I do with my stairs?  Do not leave any  items on the stairs.  Make sure that there are handrails on both sides of the stairs and use them. Fix handrails that are broken or loose. Make sure that handrails are as long as the stairways.  Check any carpeting to make sure that it is firmly attached to the stairs. Fix any carpet that is loose or worn.  Avoid having throw rugs at the top or bottom of the stairs. If you do have throw rugs, attach them to the floor with carpet tape.  Make sure that you have a light switch at the top of the stairs and the bottom of the stairs. If you do not have them, ask someone to add them for you. What else can I do to help prevent falls?  Wear shoes that:  Do not have high heels.  Have rubber bottoms.  Are comfortable and fit you well.  Are closed at the toe. Do not wear sandals.  If you use a stepladder:  Make sure that it is fully opened. Do not climb a closed stepladder.  Make sure that both sides of  the stepladder are locked into place.  Ask someone to hold it for you, if possible.  Clearly mark and make sure that you can see:  Any grab bars or handrails.  First and last steps.  Where the edge of each step is.  Use tools that help you move around (mobility aids) if they are needed. These include:  Canes.  Walkers.  Scooters.  Crutches.  Turn on the lights when you go into a dark area. Replace any light bulbs as soon as they burn out.  Set up your furniture so you have a clear path. Avoid moving your furniture around.  If any of your floors are uneven, fix them.  If there are any pets around you, be aware of where they are.  Review your medicines with your doctor. Some medicines can make you feel dizzy. This can increase your chance of falling. Ask your doctor what other things that you can do to help prevent falls. This information is not intended to replace advice given to you by your health care provider. Make sure you discuss any questions you have with your health care provider. Document Released: 03/25/2009 Document Revised: 11/04/2015 Document Reviewed: 07/03/2014 Elsevier Interactive Patient Education  2017 [employer].

## 2019-10-08 NOTE — Progress Notes (Signed)
Subjective:   Jerome Gilmore is a 59 y.o. male who presents for an Initial Medicare Annual Wellness Visit.  Virtual Visit via Telephone Note  I connected with Jerome Gilmore on 10/08/19 at  8:40 AM EDT by telephone and verified that I am speaking with the correct person using two identifiers.  Medicare Annual Wellness visit completed telephonically due to Covid-19 pandemic.   Location: Patient: home Provider: office   I discussed the limitations, risks, security and privacy concerns of performing an evaluation and management service by telephone and the availability of in person appointments. The patient expressed understanding and agreed to proceed.  Unable to perform video visit due to patient does not have video capability.   Some vital signs may be absent or patient reported.   Jerome Littler, LPN    Review of Systems   Cardiac Risk Factors include: advanced age (>99men, >54 women);dyslipidemia;hypertension;male gender    Objective:    Today's Vitals   10/08/19 0851  Weight: 215 lb (97.5 kg)  Height: 5\' 11"  (1.803 m)   Body mass index is 29.99 kg/m.  Advanced Directives 10/08/2019 04/07/2018  Does Patient Have a Medical Advance Directive? No No  Would patient like information on creating a medical advance directive? No - Patient declined No - Patient declined    Current Medications (verified) Outpatient Encounter Medications as of 10/08/2019  Medication Sig  . carbamazepine (TEGRETOL) 200 MG tablet Take 2 tablets (400 mg total) by mouth 3 (three) times daily.  . divalproex (DEPAKOTE) 500 MG DR tablet Take 1 tablet (500 mg total) by mouth 3 (three) times daily.  10/10/2019 lisinopril-hydrochlorothiazide (ZESTORETIC) 10-12.5 MG tablet Take 1 tablet by mouth daily.  Marland Kitchen lovastatin (MEVACOR) 40 MG tablet Take 1 tablet (40 mg total) by mouth at bedtime.  . meloxicam (MOBIC) 7.5 MG tablet Take 1 tablet (7.5 mg total) by mouth daily. 1 tablet daily   No facility-administered  encounter medications on file as of 10/08/2019.    Allergies (verified) Patient has no known allergies.   History: Past Medical History:  Diagnosis Date  . Hyperlipidemia   . Hypertension   . Joint pain   . Seizures (HCC)    Past Surgical History:  Procedure Laterality Date  . RIB FRACTURE SURGERY     History reviewed. No pertinent family history. Social History   Socioeconomic History  . Marital status: Single    Spouse name: Not on file  . Number of children: Not on file  . Years of education: Not on file  . Highest education level: Not on file  Occupational History  . Not on file  Tobacco Use  . Smoking status: Never Smoker  . Smokeless tobacco: Never Used  Substance and Sexual Activity  . Alcohol use: No    Alcohol/week: 0.0 standard drinks  . Drug use: No  . Sexual activity: Not Currently  Other Topics Concern  . Not on file  Social History Narrative  . Not on file   Social Determinants of Health   Financial Resource Strain: Low Risk   . Difficulty of Paying Living Expenses: Not hard at all  Food Insecurity: No Food Insecurity  . Worried About 10/10/2019 in the Last Year: Never true  . Ran Out of Food in the Last Year: Never true  Transportation Needs: No Transportation Needs  . Lack of Transportation (Medical): No  . Lack of Transportation (Non-Medical): No  Physical Activity: Inactive  . Days of Exercise per Week: 0 days  .  Minutes of Exercise per Session: 0 min  Stress: No Stress Concern Present  . Feeling of Stress : Not at all  Social Connections: Unknown  . Frequency of Communication with Friends and Family: Patient refused  . Frequency of Social Gatherings with Friends and Family: Patient refused  . Attends Religious Services: Patient refused  . Active Member of Clubs or Organizations: Patient refused  . Attends Banker Meetings: Patient refused  . Marital Status: Never married   Tobacco Counseling Counseling given:  Not Answered   Clinical Intake:  Pre-visit preparation completed: Yes  Pain : No/denies pain     BMI - recorded: 29.99 Nutritional Status: BMI 25 -29 Overweight Nutritional Risks: None Diabetes: No  How often do you need to have someone help you when you read instructions, pamphlets, or other written materials from your doctor or pharmacy?: 1 - Never  Interpreter Needed?: No  Information entered by :: Jerome Littler LPN  Activities of Daily Living In your present state of health, do you have any difficulty performing the following activities: 10/08/2019  Hearing? Y  Comment declines hearing aids  Vision? N  Difficulty concentrating or making decisions? Y  Walking or climbing stairs? N  Dressing or bathing? N  Doing errands, shopping? N  Preparing Food and eating ? N  Using the Toilet? N  In the past six months, have you accidently leaked urine? N  Do you have problems with loss of bowel control? N  Managing your Medications? N  Managing your Finances? N  Housekeeping or managing your Housekeeping? N  Some recent data might be hidden     Immunizations and Health Maintenance Immunization History  Administered Date(s) Administered  . Influenza,inj,quad, With Preservative 04/04/2017, 04/29/2018  . Influenza-Unspecified 03/12/2016, 04/12/2017, 04/13/2019  . PFIZER SARS-COV-2 Vaccination 09/10/2019, 10/01/2019  . Tdap 05/24/2016   There are no preventive care reminders to display for this patient.  Patient Care Team: Duanne Limerick, MD as PCP - General (Family Medicine)  Indicate any recent Medical Services you may have received from other than [facility] providers in the past year (date may be approximate).    Assessment:   This is a routine wellness examination for Jerome Gilmore.  Hearing/Vision screen  Hearing Screening   125Hz  250Hz  500Hz  1000Hz  2000Hz  3000Hz  4000Hz  6000Hz  8000Hz   Right ear:           Left ear:           Comments: Pt states mild hearing difficulty in  right ear  Vision Screening Comments: Past due for eye exam, established at [facility]  Dietary issues and exercise activities discussed: Current Exercise Habits: The patient does not participate in regular exercise at present, Exercise limited by: None identified  Goals    . DIET - INCREASE WATER INTAKE     Recommend drinking 6-8 glasses of water per day       Depression Screen PHQ 2/9 Scores 10/08/2019 07/11/2019 01/31/2019 08/08/2018  PHQ - 2 Score 0 0 0 0  PHQ- 9 Score - 0 0 0    Fall Risk Fall Risk  10/08/2019 07/11/2019 05/28/2017 11/25/2015  Falls in the past year? 0 0 No No  Number falls in past yr: 0 - - -  Injury with Fall? 0 - - -  Risk for fall due to : No Fall Risks - - -  Follow up Falls prevention discussed Falls evaluation completed - -    FALL RISK PREVENTION PERTAINING TO THE HOME:  Any stairs in or around the home? No  If so, do they handrails? No   Home free of loose throw rugs in walkways, pet beds, electrical cords, etc? Yes  Adequate lighting in your home to reduce risk of falls? Yes   ASSISTIVE DEVICES UTILIZED TO PREVENT FALLS:  Life alert? No  Use of a cane, walker or w/c? No  Grab bars in the bathroom? No  Shower chair or bench in shower? No  Elevated toilet seat or a handicapped toilet? No   DME ORDERS:  DME order needed?  No   TIMED UP AND GO:  Was the test performed? No . Telephonic visit.   Education: Fall risk prevention has been discussed.  Intervention(s) required? No    Cognitive Function: Pt declined 6CIT for 2021 AWV         Screening Tests Health Maintenance  Topic Date Due  . HIV Screening  01/31/2020 (Originally 10/16/1975)  . INFLUENZA VACCINE  01/11/2020  . COLONOSCOPY  11/24/2025  . TETANUS/TDAP  05/24/2026  . COVID-19 Vaccine  Completed  . Hepatitis C Screening  Completed    Qualifies for Shingles Vaccine? Yes . Due for Shingrix. Education has been provided regarding the importance of this vaccine. Pt  has been advised to call insurance company to determine out of pocket expense. Advised may also receive vaccine at local pharmacy or Health Dept. Verbalized acceptance and understanding.  Tdap: Up to date  Flu Vaccine: Up to date  Pneumococcal Vaccine: Due for Pneumococcal vaccine at age 8.   Cancer Screenings:  Colorectal Screening: Completed 11/25/15. Repeat every 10 years;   Lung Cancer Screening: (Low Dose CT Chest recommended if Age 22-80 years, 30 pack-year currently smoking OR have quit w/in 15years.) does not qualify.    Additional Screening:  Hepatitis C Screening: does qualify; Completed 05/24/16  Vision Screening: Recommended annual ophthalmology exams for early detection of glaucoma and other disorders of the eye. Is the patient up to date with their annual eye exam?  No  Who is the provider or what is the name of the office in which the pt attends annual eye exams? [place] Screening: Recommended annual dental exams for proper oral hygiene  Community Resource Referral:  CRR required this visit?  No        Plan:    I have personally reviewed and addressed the Medicare Annual Wellness questionnaire and have noted the following in the patient's chart:  A. Medical and social history B. Use of alcohol, tobacco or illicit drugs  C. Current medications and supplements D. Functional ability and status E.  Nutritional status F.  Physical activity G. Advance directives H. List of other physicians I.  Hospitalizations, surgeries, and ER visits in previous 12 months J.  [place] such as hearing and vision if needed, cognitive and depression L. Referrals and appointments   In addition, I have reviewed and discussed with patient certain preventive protocols, quality metrics, and best practice recommendations. A written personalized care plan for preventive services as well as general preventive health recommendations were provided to  patient.   Signed,  Clemetine Marker, LPN Nurse Health Advisor   Nurse Notes: none

## 2019-11-13 ENCOUNTER — Other Ambulatory Visit: Payer: Self-pay

## 2019-11-13 ENCOUNTER — Other Ambulatory Visit: Payer: Self-pay | Admitting: Family Medicine

## 2019-11-13 DIAGNOSIS — G40909 Epilepsy, unspecified, not intractable, without status epilepticus: Secondary | ICD-10-CM

## 2019-11-13 DIAGNOSIS — M1990 Unspecified osteoarthritis, unspecified site: Secondary | ICD-10-CM

## 2019-11-13 MED ORDER — DIVALPROEX SODIUM 500 MG PO DR TAB: 500.0000 mg | DELAYED_RELEASE_TABLET | Freq: Three times a day (TID) | ORAL | 0 refills | Status: DC

## 2019-11-13 MED ORDER — MELOXICAM 7.5 MG PO TABS: 7.5000 mg | ORAL_TABLET | Freq: Every day | ORAL | 1 refills | Status: DC

## 2019-11-13 NOTE — Telephone Encounter (Signed)
Requested medication (s) are due for refill today: yes  Requested medication (s) are on the active medication list: yes  Last refill:  07/11/2019  Future visit scheduled: yes  Notes to clinic:  these refill cannot be delegated    Requested Prescriptions  Pending Prescriptions Disp Refills   divalproex (DEPAKOTE) 500 MG DR tablet 270 tablet 1    Sig: Take 1 tablet (500 mg total) by mouth 3 (three) times daily.      Not Delegated - Neurology:  Anticonvulsants - Valproates Failed - 11/13/2019 11:11 AM      Failed - This refill cannot be delegated      Passed - AST in normal range and within 360 days    AST  Date Value Ref Range Status  01/31/2019 13 0 - 40 IU/L Final          Passed - ALT in normal range and within 360 days    ALT  Date Value Ref Range Status  01/31/2019 8 0 - 44 IU/L Final          Passed - HGB in normal range and within 360 days    Hemoglobin  Date Value Ref Range Status  07/11/2019 14.2 13.0 - 17.7 g/dL Final          Passed - PLT in normal range and within 360 days    Platelets  Date Value Ref Range Status  07/11/2019 216 150 - 450 x10E3/uL Final          Passed - WBC in normal range and within 360 days    WBC  Date Value Ref Range Status  07/11/2019 4.9 3.4 - 10.8 x10E3/uL Final  04/07/2018 8.8 4.0 - 10.5 K/uL Final          Passed - HCT in normal range and within 360 days    Hematocrit  Date Value Ref Range Status  07/11/2019 42.3 37.5 - 51.0 % Final          Passed - Valproic Acid (serum) in normal range and within 360 days    Valproic Acid Lvl  Date Value Ref Range Status  01/31/2019 71 50 - 100 ug/mL Final    Comment:                                    Detection Limit = 4                            <4 indicates None Detected Toxicity may occur at levels of 100-500. Measurements of free unbound valproic acid may improve the assess- ment of clinical response.           Passed - Valid encounter within last 12 months   Recent Outpatient Visits           4 months ago Essential hypertension   [place], [place]   9 months ago Essential hypertension   [facility] Juline Patch, MD   1 year ago Essential hypertension   [facility] Juline Patch, MD   1 year ago Essential hypertension   [facility] Juline Patch, MD   2 years ago Seizure disorder [facility])   [facility] Juline Patch, MD       Future Appointments  In 1 month Duanne Limerick, MD [facility]              carbamazepine (TEGRETOL) 200 MG tablet 540 tablet 1    Sig: Take 2 tablets (400 mg total) by mouth 3 (three) times daily.      Not Delegated - Neurology:  Anticonvulsants - carbamazepine Failed - 11/13/2019 11:11 AM      Failed - This refill cannot be delegated      Failed - AST in normal range and within 90 days    AST  Date Value Ref Range Status  01/31/2019 13 0 - 40 IU/L Final          Failed - ALT in normal range and within 90 days    ALT  Date Value Ref Range Status  01/31/2019 8 0 - 44 IU/L Final          Failed - WBC in normal range and within 90 days    WBC  Date Value Ref Range Status  07/11/2019 4.9 3.4 - 10.8 x10E3/uL Final  04/07/2018 8.8 4.0 - 10.5 K/uL Final          Failed - PLT in normal range and within 90 days    Platelets  Date Value Ref Range Status  07/11/2019 216 150 - 450 x10E3/uL Final          Failed - HGB in normal range and within 90 days    Hemoglobin  Date Value Ref Range Status  07/11/2019 14.2 13.0 - 17.7 g/dL Final          Failed - Na in normal range and within 90 days    Sodium  Date Value Ref Range Status  01/31/2019 135 134 - 144 mmol/L Final          Failed - HCT in normal range and within 90 days    Hematocrit  Date Value Ref Range Status  07/11/2019 42.3 37.5 - 51.0 % Final          Passed - Carbamazepine (serum) in normal range and within 360 days     Carbamazepine (Tegretol), S  Date Value Ref Range Status  01/31/2019 11.3 4.0 - 12.0 ug/mL Final    Comment:             In conjunction with other antiepileptic drugs                                Therapeutic  4.0 -  8.0                                Toxicity     9.0 - 12.0                                    Carbamazepine alone                                Therapeutic  8.0 - 12.0                                 Detection Limit =  2.0                           <  2.0 indicated None Detected           Passed - Valid encounter within last 12 months    Recent Outpatient Visits           4 months ago Essential hypertension   [facility] Duanne Limerick, MD   9 months ago Essential hypertension   [facility] Duanne Limerick, MD   1 year ago Essential hypertension   [facility] Duanne Limerick, MD   1 year ago Essential hypertension   [facility] Duanne Limerick, MD   2 years ago Seizure disorder [facility])   [facility] Duanne Limerick, MD       Future Appointments             In 1 month Duanne Limerick, MD [facility]             Signed Prescriptions Disp Refills   meloxicam (MOBIC) 7.5 MG tablet 90 tablet 1    Sig: Take 1 tablet (7.5 mg total) by mouth daily. 1 tablet daily      Analgesics:  COX2 Inhibitors Passed - 11/13/2019 11:11 AM      Passed - HGB in normal range and within 360 days    Hemoglobin  Date Value Ref Range Status  07/11/2019 14.2 13.0 - 17.7 g/dL Final          Passed - Cr in normal range and within 360 days    Creatinine, Ser  Date Value Ref Range Status  01/31/2019 0.90 0.76 - 1.27 mg/dL Final          Passed - Patient is not pregnant      Passed - Valid encounter within last 12 months    Recent Outpatient Visits           4 months ago Essential hypertension   [facility] Duanne Limerick, MD   9 months ago Essential hypertension   [facility] Duanne Limerick, MD   1 year ago Essential hypertension   [facility] Duanne Limerick, MD   1 year ago Essential hypertension   [facility] Duanne Limerick, MD   2 years ago Seizure disorder [facility])   [facility] Duanne Limerick, MD       Future Appointments             In 1 month Duanne Limerick, MD [facility]

## 2019-11-13 NOTE — Telephone Encounter (Signed)
Medication Refill - Medication:  Meloxicam 7.5 Divalproex 500MG  Carbamazepine 200mg   Has the patient contacted their pharmacy? Yes.   (Agent: If no, request that the patient contact the pharmacy for the refill.) (Agent: If yes, when and what did the pharmacy advise?)  Preferred Pharmacy (with phone number or street name): [facility]  Fax [phone] Phone [phone]  Agent: Please be advised that RX refills may take up to 3 business days. We ask that you follow-up with your pharmacy.

## 2019-11-13 NOTE — Telephone Encounter (Signed)
done

## 2019-11-13 NOTE — Progress Notes (Unsigned)
Sent in Divalproex to [facility]

## 2019-11-14 MED ORDER — CARBAMAZEPINE 200 MG PO TABS: 400.0000 mg | ORAL_TABLET | Freq: Three times a day (TID) | ORAL | 0 refills | Status: DC

## 2019-11-14 NOTE — Telephone Encounter (Signed)
Pt is not with this practice. Sent to the wrong office. Want to assure pt get meds on time  Thanks

## 2020-01-08 ENCOUNTER — Other Ambulatory Visit: Payer: Self-pay

## 2020-01-08 ENCOUNTER — Ambulatory Visit: Payer: Medicare PPO | Admitting: Family Medicine

## 2020-01-08 ENCOUNTER — Encounter: Payer: Self-pay | Admitting: Family Medicine

## 2020-01-08 VITALS — BP 116/82 | HR 63 | Ht 71.0 in | Wt 207.0 lb

## 2020-01-08 DIAGNOSIS — G40909 Epilepsy, unspecified, not intractable, without status epilepticus: Secondary | ICD-10-CM | POA: Diagnosis not present

## 2020-01-08 DIAGNOSIS — I10 Essential (primary) hypertension: Secondary | ICD-10-CM | POA: Diagnosis not present

## 2020-01-08 DIAGNOSIS — E7849 Other hyperlipidemia: Secondary | ICD-10-CM | POA: Diagnosis not present

## 2020-01-08 DIAGNOSIS — R69 Illness, unspecified: Secondary | ICD-10-CM

## 2020-01-08 DIAGNOSIS — M1990 Unspecified osteoarthritis, unspecified site: Secondary | ICD-10-CM | POA: Diagnosis not present

## 2020-01-08 MED ORDER — DIVALPROEX SODIUM 500 MG PO DR TAB: 500.0000 mg | DELAYED_RELEASE_TABLET | Freq: Three times a day (TID) | ORAL | 1 refills | Status: DC

## 2020-01-08 MED ORDER — LISINOPRIL-HYDROCHLOROTHIAZIDE 10-12.5 MG PO TABS: 1.0000 | ORAL_TABLET | Freq: Every day | ORAL | 1 refills | Status: DC

## 2020-01-08 MED ORDER — CARBAMAZEPINE 200 MG PO TABS: 400.0000 mg | ORAL_TABLET | Freq: Three times a day (TID) | ORAL | 1 refills | Status: DC

## 2020-01-08 MED ORDER — LOVASTATIN 40 MG PO TABS: 40.0000 mg | ORAL_TABLET | Freq: Every day | ORAL | 1 refills | Status: DC

## 2020-01-08 MED ORDER — MELOXICAM 7.5 MG PO TABS: 7.5000 mg | ORAL_TABLET | Freq: Every day | ORAL | 1 refills | Status: DC

## 2020-01-08 NOTE — Progress Notes (Signed)
Date:  01/08/2020   Name:  Jerome Gilmore   DOB:  [birth year]   MRN:  [id number]   Chief Complaint: Hypertension (follow up ), Hyperlipidemia, and Seizures  Hypertension This is a chronic problem. The current episode started more than 1 year ago. The problem has been gradually improving since onset. The problem is controlled. Pertinent negatives include no anxiety, blurred vision, chest pain, headaches, malaise/fatigue, neck pain, orthopnea, palpitations, peripheral edema, PND, shortness of breath or sweats. There are no associated agents to hypertension. There are no known risk factors for coronary artery disease. Past treatments include ACE inhibitors and diuretics. The current treatment provides moderate improvement. There is no history of angina, kidney disease, CAD/MI, CVA, heart failure, left ventricular hypertrophy, PVD or retinopathy. There is no history of chronic renal disease, a hypertension causing med or renovascular disease.  Hyperlipidemia This is a chronic problem. The current episode started more than 1 year ago. The problem is controlled. Recent lipid tests were reviewed and are normal. He has no history of chronic renal disease, diabetes, hypothyroidism, liver disease, obesity or nephrotic syndrome. Factors aggravating his hyperlipidemia include thiazides. Pertinent negatives include no chest pain, myalgias or shortness of breath. The current treatment provides moderate improvement of lipids. There are no compliance problems.  Risk factors for coronary artery disease include dyslipidemia.  Seizures  This is a chronic (seizure disorder/controlled) problem. Pertinent negatives include no headaches, no sore throat, no chest pain, no cough, no nausea and no diarrhea. Characteristics do not include eye blinking, eye deviation, bowel incontinence, bladder incontinence, rhythmic jerking, loss of consciousness, bit tongue, apnea or cyanosis.  Arthritis Presents for follow-up visit. He  complains of pain and stiffness. He reports no joint swelling or joint warmth. Affected locations include the right knee. His pain is at a severity of 2/10. Pertinent negatives include no diarrhea, dry eyes, dry mouth, dysuria, fever, pain at night, pain while resting, rash or Raynaud's syndrome.    Lab Results  Component Value Date   CREATININE 0.90 01/31/2019   BUN 19 01/31/2019   NA 135 01/31/2019   K 5.3 (H) 01/31/2019   CL 95 (L) 01/31/2019   CO2 28 01/31/2019   Lab Results  Component Value Date   CHOL 216 (H) 07/11/2019   HDL 83 07/11/2019   LDLCALC 108 (H) 07/11/2019   TRIG 144 07/11/2019   CHOLHDL 2.8 02/20/2018   No results found for: TSH No results found for: HGBA1C Lab Results  Component Value Date   WBC 4.9 07/11/2019   HGB 14.2 07/11/2019   HCT 42.3 07/11/2019   MCV 91 07/11/2019   PLT 216 07/11/2019   Lab Results  Component Value Date   ALT 8 01/31/2019   AST 13 01/31/2019   ALKPHOS 41 01/31/2019   BILITOT 0.2 01/31/2019     Review of Systems  Constitutional: Negative for chills, fever and malaise/fatigue.  HENT: Negative for drooling, ear discharge, ear pain and sore throat.   Eyes: Negative for blurred vision.  Respiratory: Negative for apnea, cough, shortness of breath and wheezing.   Cardiovascular: Negative for chest pain, palpitations, orthopnea, leg swelling, PND and cyanosis.  Gastrointestinal: Negative for abdominal pain, blood in stool, bowel incontinence, constipation, diarrhea and nausea.  Endocrine: Negative for polydipsia.  Genitourinary: Negative for bladder incontinence, dysuria, frequency, hematuria and urgency.  Musculoskeletal: Positive for arthritis and stiffness. Negative for back pain, joint swelling, myalgias and neck pain.  Skin: Negative for rash.  Allergic/Immunologic: Negative for environmental allergies.  Neurological: Positive for seizures. Negative for dizziness, loss of consciousness and headaches.  Hematological: Does  not bruise/bleed easily.  Psychiatric/Behavioral: Negative for suicidal ideas. The patient is not nervous/anxious.     Patient Active Problem List   Diagnosis Date Noted  . Arthritis 07/11/2019  . Taking medication for chronic disease 07/11/2019  . Primary osteoarthritis of both knees 05/24/2016  . Hyperlipidemia 11/25/2015  . Seizure disorder (HCC) 11/25/2015  . Primary osteoarthritis of right knee 11/25/2015  . Seizure (HCC) 12/03/2014  . Hypertension 11/26/2014    No Known Allergies  Past Surgical History:  Procedure Laterality Date  . RIB FRACTURE SURGERY      Social History   Tobacco Use  . Smoking status: Never Smoker  . Smokeless tobacco: Never Used  Vaping Use  . Vaping Use: Never used  Substance Use Topics  . Alcohol use: No    Alcohol/week: 0.0 standard drinks  . Drug use: No     Medication list has been reviewed and updated.  Current Meds  Medication Sig  . carbamazepine (TEGRETOL) 200 MG tablet Take 2 tablets (400 mg total) by mouth 3 (three) times daily.  . divalproex (DEPAKOTE) 500 MG DR tablet Take 1 tablet (500 mg total) by mouth 3 (three) times daily.  Marland Kitchen lisinopril-hydrochlorothiazide (ZESTORETIC) 10-12.5 MG tablet Take 1 tablet by mouth daily.  Marland Kitchen lovastatin (MEVACOR) 40 MG tablet Take 1 tablet (40 mg total) by mouth at bedtime.  . meloxicam (MOBIC) 7.5 MG tablet Take 1 tablet (7.5 mg total) by mouth daily. 1 tablet daily    PHQ 2/9 Scores 01/08/2020 10/08/2019 07/11/2019 01/31/2019  PHQ - 2 Score 0 0 0 0  PHQ- 9 Score 0 - 0 0    GAD 7 : Generalized Anxiety Score 01/08/2020 07/11/2019  Nervous, Anxious, on Edge 0 0  Control/stop worrying 0 0  Worry too much - different things 0 0  Trouble relaxing 0 0  Restless 0 0  Easily annoyed or irritable 0 0  Afraid - awful might happen 0 0  Total GAD 7 Score 0 0  Anxiety Difficulty Not difficult at all -    BP Readings from Last 3 Encounters:  01/08/20 116/82  07/11/19 128/80  01/31/19 122/82     Physical Exam Vitals and nursing note reviewed.  HENT:     Head: Normocephalic.     Right Ear: Tympanic membrane, ear canal and external ear normal.     Left Ear: Tympanic membrane, ear canal and external ear normal.     Nose: Nose normal. No congestion or rhinorrhea.     Mouth/Throat:     Mouth: Mucous membranes are moist.  Eyes:     General: No scleral icterus.       Right eye: No discharge.        Left eye: No discharge.     Conjunctiva/sclera: Conjunctivae normal.     Pupils: Pupils are equal, round, and reactive to light.  Neck:     Thyroid: No thyromegaly.     Vascular: No JVD.     Trachea: No tracheal deviation.  Cardiovascular:     Rate and Rhythm: Normal rate and regular rhythm.     Heart sounds: Normal heart sounds. No murmur heard.  No friction rub. No gallop.   Pulmonary:     Effort: No respiratory distress.     Breath sounds: Normal breath sounds. No wheezing or rales.  Abdominal:     General: Bowel sounds are normal.  Palpations: Abdomen is soft. There is no mass.     Tenderness: There is no abdominal tenderness. There is no guarding or rebound.  Musculoskeletal:        General: Normal range of motion.     Cervical back: Normal range of motion and neck supple.     Right knee: Tenderness present over the lateral joint line.  Lymphadenopathy:     Cervical: No cervical adenopathy.  Skin:    General: Skin is warm.     Findings: No rash.  Neurological:     Mental Status: He is alert and oriented to person, place, and time.     Cranial Nerves: No cranial nerve deficit.     Deep Tendon Reflexes: Reflexes are normal and symmetric.     Wt Readings from Last 3 Encounters:  01/08/20 (!) 207 lb (93.9 kg)  10/08/19 215 lb (97.5 kg)  07/11/19 213 lb (96.6 kg)    BP 116/82   Pulse 63   Ht 5\' 11"  (1.803 m)   Wt (!) 207 lb (93.9 kg)   SpO2 96%   BMI 28.87 kg/m   Assessment and Plan: 1. Arthritis Chronic.  Persistent.  Episodic.  Exam is consistent  with likely osteoarthritis of the lateral aspect of the right knee.  We will continue meloxicam 7.5 mg 1 tablet daily. - meloxicam (MOBIC) 7.5 MG tablet; Take 1 tablet (7.5 mg total) by mouth daily. 1 tablet daily  Dispense: 90 tablet; Refill: 1  2. Essential hypertension Chronic.  Controlled.  Stable.  Continue lisinopril hydrochlorothiazide 10-12.5 mg once a day.  Will check CMP for electrolyte surveillance as well as GFR concerns. - lisinopril-hydrochlorothiazide (ZESTORETIC) 10-12.5 MG tablet; Take 1 tablet by mouth daily.  Dispense: 90 tablet; Refill: 1 - Comprehensive metabolic panel  3. Other hyperlipidemia Chronic.  Controlled.  Stable.  Continue lovastatin 40 mg once a day.  Will evaluate current level of control with lipid panel. - lovastatin (MEVACOR) 40 MG tablet; Take 1 tablet (40 mg total) by mouth at bedtime.  Dispense: 90 tablet; Refill: 1 - Lipid Panel With LDL/HDL Ratio  4. Seizure disorder (HCC) Chronic.  Controlled.  Stable.  No active seizure noted since last visit.  Continue Tegretol 200 mg 2 tablets 3 times a day as well as Depakote 500 mg 1 tablet 3 times a day.  We will check a Tegretol level as well as a valproic acid level. - carbamazepine (TEGRETOL) 200 MG tablet; Take 2 tablets (400 mg total) by mouth 3 (three) times daily.  Dispense: 540 tablet; Refill: 1 - divalproex (DEPAKOTE) 500 MG DR tablet; Take 1 tablet (500 mg total) by mouth 3 (three) times daily.  Dispense: 270 tablet; Refill: 1 - Carbamazepine Level (Tegretol), total - Valproic Acid level  5. Taking medication for chronic disease Patient is currently on medications which may cause marital concerns and we will check a CBC along with a CMP for hepatic concerns. - CBC with Differential/Platelet

## 2020-01-09 LAB — COMPREHENSIVE METABOLIC PANEL
ALT: 8 IU/L (ref 0–44)
AST: 13 IU/L (ref 0–40)
Albumin/Globulin Ratio: 2.3 — ABNORMAL HIGH (ref 1.2–2.2)
Albumin: 4.3 g/dL (ref 3.8–4.9)
Alkaline Phosphatase: 41 IU/L — ABNORMAL LOW (ref 48–121)
BUN/Creatinine Ratio: 17 (ref 9–20)
BUN: 15 mg/dL (ref 6–24)
Bilirubin Total: <0.2 mg/dL (ref 0.0–1.2)
CO2: 28 mmol/L (ref 20–29)
Calcium: 9 mg/dL (ref 8.7–10.2)
Chloride: 98 mmol/L (ref 96–106)
Creatinine, Ser: 0.88 mg/dL (ref 0.76–1.27)
GFR calc Af Amer: 110 mL/min/{1.73_m2} (ref >59)
GFR calc non Af Amer: 95 mL/min/{1.73_m2} (ref >59)
Globulin, Total: 1.9 g/dL (ref 1.5–4.5)
Glucose: 105 mg/dL — ABNORMAL HIGH (ref 65–99)
Potassium: 4.8 mmol/L (ref 3.5–5.2)
Sodium: 138 mmol/L (ref 134–144)
Total Protein: 6.2 g/dL (ref 6.0–8.5)

## 2020-01-09 LAB — CBC WITH DIFFERENTIAL/PLATELET
Basophils Absolute: 0.1 10*3/uL (ref 0.0–0.2)
Basos: 1 %
EOS (ABSOLUTE): 0.2 10*3/uL (ref 0.0–0.4)
Eos: 4 %
Hematocrit: 37.6 % (ref 37.5–51.0)
Hemoglobin: 13 g/dL (ref 13.0–17.7)
Immature Grans (Abs): 0 10*3/uL (ref 0.0–0.1)
Immature Granulocytes: 0 %
Lymphocytes Absolute: 1.7 10*3/uL (ref 0.7–3.1)
Lymphs: 35 %
MCH: 32 pg (ref 26.6–33.0)
MCHC: 34.6 g/dL (ref 31.5–35.7)
MCV: 93 fL (ref 79–97)
Monocytes Absolute: 0.5 10*3/uL (ref 0.1–0.9)
Monocytes: 11 %
Neutrophils Absolute: 2.3 10*3/uL (ref 1.4–7.0)
Neutrophils: 49 %
Platelets: 187 10*3/uL (ref 150–450)
RBC: 4.06 x10E6/uL — ABNORMAL LOW (ref 4.14–5.80)
RDW: 13 % (ref 11.6–15.4)
WBC: 4.8 10*3/uL (ref 3.4–10.8)

## 2020-01-09 LAB — LIPID PANEL WITH LDL/HDL RATIO
Cholesterol, Total: 209 mg/dL — ABNORMAL HIGH (ref 100–199)
HDL: 71 mg/dL (ref >39)
LDL Chol Calc (NIH): 103 mg/dL — ABNORMAL HIGH (ref 0–99)
LDL/HDL Ratio: 1.5 ratio (ref 0.0–3.6)
Triglycerides: 210 mg/dL — ABNORMAL HIGH (ref 0–149)
VLDL Cholesterol Cal: 35 mg/dL (ref 5–40)

## 2020-01-09 LAB — CARBAMAZEPINE LEVEL, TOTAL: Carbamazepine (Tegretol), S: 9.8 ug/mL (ref 4.0–12.0)

## 2020-01-09 LAB — VALPROIC ACID LEVEL: Valproic Acid Lvl: 86 ug/mL (ref 50–100)

## 2020-02-06 ENCOUNTER — Other Ambulatory Visit: Payer: Self-pay | Admitting: Family Medicine

## 2020-02-06 DIAGNOSIS — G40909 Epilepsy, unspecified, not intractable, without status epilepticus: Secondary | ICD-10-CM

## 2020-03-17 ENCOUNTER — Telehealth: Payer: Self-pay

## 2020-03-17 NOTE — Telephone Encounter (Unsigned)
Copied from CRM [id number]. Topic: General - Other >> Mar 17, 2020 12:03 PM Dalphine Handing A wrote: Patient wants to know if Dr .Yetta Barre would advise he receive the covid booster

## 2020-03-18 NOTE — Telephone Encounter (Signed)
Get the booster- spoke to pt

## 2020-06-22 ENCOUNTER — Encounter: Payer: Self-pay | Admitting: Family Medicine

## 2020-06-22 ENCOUNTER — Ambulatory Visit: Payer: Medicare PPO | Admitting: Family Medicine

## 2020-06-22 ENCOUNTER — Other Ambulatory Visit: Payer: Self-pay

## 2020-06-22 DIAGNOSIS — G40909 Epilepsy, unspecified, not intractable, without status epilepticus: Secondary | ICD-10-CM

## 2020-06-22 DIAGNOSIS — M1990 Unspecified osteoarthritis, unspecified site: Secondary | ICD-10-CM

## 2020-06-22 DIAGNOSIS — I10 Essential (primary) hypertension: Secondary | ICD-10-CM

## 2020-06-22 DIAGNOSIS — E7849 Other hyperlipidemia: Secondary | ICD-10-CM

## 2020-06-22 MED ORDER — MELOXICAM 7.5 MG PO TABS: 7.5000 mg | ORAL_TABLET | Freq: Every day | ORAL | 1 refills | Status: DC

## 2020-06-22 MED ORDER — LISINOPRIL-HYDROCHLOROTHIAZIDE 10-12.5 MG PO TABS: 1.0000 | ORAL_TABLET | Freq: Every day | ORAL | 1 refills | Status: DC

## 2020-06-22 MED ORDER — CARBAMAZEPINE 200 MG PO TABS: 400.0000 mg | ORAL_TABLET | Freq: Three times a day (TID) | ORAL | 1 refills | Status: DC

## 2020-06-22 MED ORDER — LOVASTATIN 40 MG PO TABS: 40.0000 mg | ORAL_TABLET | Freq: Every day | ORAL | 1 refills | Status: DC

## 2020-06-22 MED ORDER — DIVALPROEX SODIUM 500 MG PO DR TAB: 500.0000 mg | DELAYED_RELEASE_TABLET | Freq: Three times a day (TID) | ORAL | 1 refills | Status: DC

## 2020-06-22 NOTE — Progress Notes (Signed)
Date:  06/22/2020   Name:  Jerome Gilmore   DOB:  17-Mar-1961   MRN:  [id number]   Chief Complaint: Hyperlipidemia, Hypertension, Seizures, and Joint Pain (Takes meloxicam)  Hyperlipidemia This is a chronic problem. The current episode started more than 1 year ago. The problem is controlled. Recent lipid tests were reviewed and are normal. He has no history of chronic renal disease, diabetes, hypothyroidism, liver disease, obesity or nephrotic syndrome. Factors aggravating his hyperlipidemia include thiazides. Pertinent negatives include no chest pain, focal sensory loss, focal weakness, leg pain, myalgias or shortness of breath. The current treatment provides moderate improvement of lipids.  Hypertension This is a chronic problem. The current episode started more than 1 year ago. The problem is controlled. Pertinent negatives include no chest pain, headaches, neck pain, palpitations or shortness of breath. There are no associated agents to hypertension. Risk factors for coronary artery disease include dyslipidemia. Past treatments include ACE inhibitors and diuretics. The current treatment provides moderate improvement. There are no compliance problems.  There is no history of angina, kidney disease, CAD/MI, CVA, heart failure, left ventricular hypertrophy, PVD or retinopathy. There is no history of chronic renal disease, a hypertension causing med or renovascular disease.  Seizures  This is a chronic (controlled on current regimen) problem. The problem has not changed since onset.Pertinent negatives include no headaches, no sore throat, no chest pain, no cough, no nausea and no diarrhea.    Lab Results  Component Value Date   CREATININE 0.88 01/08/2020   BUN 15 01/08/2020   NA 138 01/08/2020   K 4.8 01/08/2020   CL 98 01/08/2020   CO2 28 01/08/2020   Lab Results  Component Value Date   CHOL 209 (H) 01/08/2020   HDL 71 01/08/2020   LDLCALC 103 (H) 01/08/2020   TRIG 210 (H) 01/08/2020    CHOLHDL 2.8 02/20/2018   No results found for: TSH No results found for: HGBA1C Lab Results  Component Value Date   WBC 4.8 01/08/2020   HGB 13.0 01/08/2020   HCT 37.6 01/08/2020   MCV 93 01/08/2020   PLT 187 01/08/2020   Lab Results  Component Value Date   ALT 8 01/08/2020   AST 13 01/08/2020   ALKPHOS 41 (L) 01/08/2020   BILITOT <0.2 01/08/2020     Review of Systems  Constitutional: Negative for chills and fever.  HENT: Negative for drooling, ear discharge, ear pain and sore throat.   Respiratory: Negative for cough, shortness of breath and wheezing.   Cardiovascular: Negative for chest pain, palpitations and leg swelling.  Gastrointestinal: Negative for abdominal pain, blood in stool, constipation, diarrhea and nausea.  Endocrine: Negative for polydipsia.  Genitourinary: Negative for dysuria, frequency, hematuria and urgency.  Musculoskeletal: Negative for back pain, myalgias and neck pain.  Skin: Negative for rash.  Allergic/Immunologic: Negative for environmental allergies.  Neurological: Positive for seizures. Negative for dizziness, focal weakness and headaches.  Hematological: Does not bruise/bleed easily.  Psychiatric/Behavioral: Negative for suicidal ideas. The patient is not nervous/anxious.     Patient Active Problem List   Diagnosis Date Noted  . Arthritis 07/11/2019  . Taking medication for chronic disease 07/11/2019  . Primary osteoarthritis of both knees 05/24/2016  . Hyperlipidemia 11/25/2015  . Seizure disorder (HCC) 11/25/2015  . Primary osteoarthritis of right knee 11/25/2015  . Seizure (HCC) 12/03/2014  . Hypertension 11/26/2014    No Known Allergies  Past Surgical History:  Procedure Laterality Date  . RIB FRACTURE SURGERY  Social History   Tobacco Use  . Smoking status: Never Smoker  . Smokeless tobacco: Never Used  Vaping Use  . Vaping Use: Never used  Substance Use Topics  . Alcohol use: No    Alcohol/week: 0.0 standard  drinks  . Drug use: No     Medication list has been reviewed and updated.  Current Meds  Medication Sig  . carbamazepine (TEGRETOL) 200 MG tablet TAKE 2 TABLETS BY MOUTH THREE TIMES DAILY  . divalproex (DEPAKOTE) 500 MG DR tablet Take 1 tablet (500 mg total) by mouth 3 (three) times daily.  Marland Kitchen lisinopril-hydrochlorothiazide (ZESTORETIC) 10-12.5 MG tablet Take 1 tablet by mouth daily.  Marland Kitchen lovastatin (MEVACOR) 40 MG tablet Take 1 tablet (40 mg total) by mouth at bedtime.  . meloxicam (MOBIC) 7.5 MG tablet Take 1 tablet (7.5 mg total) by mouth daily. 1 tablet daily    PHQ 2/9 Scores 06/22/2020 01/08/2020 10/08/2019 07/11/2019  PHQ - 2 Score 0 0 0 0  PHQ- 9 Score 0 0 - 0    GAD 7 : Generalized Anxiety Score 06/22/2020 01/08/2020 07/11/2019  Nervous, Anxious, on Edge 0 0 0  Control/stop worrying 0 0 0  Worry too much - different things 0 0 0  Trouble relaxing 0 0 0  Restless 0 0 0  Easily annoyed or irritable 0 0 0  Afraid - awful might happen 0 0 0  Total GAD 7 Score 0 0 0  Anxiety Difficulty - Not difficult at all -    BP Readings from Last 3 Encounters:  06/22/20 120/80  01/08/20 116/82  07/11/19 128/80    Physical Exam Vitals and nursing note reviewed.  HENT:     Head: Normocephalic.     Right Ear: Tympanic membrane and external ear normal.     Left Ear: Tympanic membrane and external ear normal.     Nose: Nose normal. No congestion or rhinorrhea.     Mouth/Throat:     Mouth: Oropharynx is clear and moist. Mucous membranes are moist.  Eyes:     General: No scleral icterus.       Right eye: No discharge.        Left eye: No discharge.     Extraocular Movements: EOM normal.     Conjunctiva/sclera: Conjunctivae normal.     Pupils: Pupils are equal, round, and reactive to light.  Neck:     Thyroid: No thyromegaly.     Vascular: No JVD.     Trachea: No tracheal deviation.  Cardiovascular:     Rate and Rhythm: Normal rate and regular rhythm.     Pulses: Intact distal  pulses.     Heart sounds: Normal heart sounds. No murmur heard. No friction rub. No gallop.   Pulmonary:     Effort: No respiratory distress.     Breath sounds: Normal breath sounds. No wheezing, rhonchi or rales.  Abdominal:     General: Bowel sounds are normal.     Palpations: Abdomen is soft. There is no hepatosplenomegaly or mass.     Tenderness: There is no abdominal tenderness. There is no CVA tenderness, guarding or rebound.  Musculoskeletal:        General: No tenderness or edema. Normal range of motion.     Cervical back: Normal range of motion and neck supple.  Lymphadenopathy:     Cervical: No cervical adenopathy.  Skin:    General: Skin is warm.     Findings: No rash.  Neurological:  Mental Status: He is alert and oriented to person, place, and time.     Cranial Nerves: No cranial nerve deficit.     Deep Tendon Reflexes: Strength normal and reflexes are normal and symmetric.     Wt Readings from Last 3 Encounters:  06/22/20 231 lb (104.8 kg)  01/08/20 (!) 207 lb (93.9 kg)  10/08/19 215 lb (97.5 kg)    BP 120/80   Pulse 80   Ht 5\' 11"  (1.803 m)   Wt 231 lb (104.8 kg)   BMI 32.22 kg/m   Assessment and Plan:  1. Arthritis Chronic.  Controlled.  Stable.  Patient has chronic osteoarthritis of both knees the right greater than the left.  Patient will continue meloxicam 7.5 mg once a day. - meloxicam (MOBIC) 7.5 MG tablet; Take 1 tablet (7.5 mg total) by mouth daily. 1 tablet daily  Dispense: 90 tablet; Refill: 1  2. Essential hypertension Chronic.  Controlled.  Stable.  Blood pressure 120/80.  Continue lisinopril hydrochlorothiazide 10-12.5 mg once a day.  We will recheck in 6 months review of CMP is unremarkable for concern - lisinopril-hydrochlorothiazide (ZESTORETIC) 10-12.5 MG tablet; Take 1 tablet by mouth daily.  Dispense: 90 tablet; Refill: 1  3. Other hyperlipidemia Chronic.  Controlled.  Stable.  Continue lovastatin 40 mg once a day.  We will recheck  and 6 months. - lovastatin (MEVACOR) 40 MG tablet; Take 1 tablet (40 mg total) by mouth at bedtime.  Dispense: 90 tablet; Refill: 1  4. Seizure disorder (HCC) Chronic.  Controlled.  Stable.  Review Depakote levels and Tegretol levels from July have been controlled and stable.  We will continue on current dosing of Tegretol 200 mg 2 tablets 3 times a day and Depakote 500 mg 1 3 times a day.  We will recheck patient with labs in 6 months - divalproex (DEPAKOTE) 500 MG DR tablet; Take 1 tablet (500 mg total) by mouth 3 (three) times daily.  Dispense: 270 tablet; Refill: 1 - carbamazepine (TEGRETOL) 200 MG tablet; Take 2 tablets (400 mg total) by mouth 3 (three) times daily.  Dispense: 540 tablet; Refill: 1

## 2020-07-08 ENCOUNTER — Ambulatory Visit: Payer: Medicare PPO | Admitting: Family Medicine

## 2020-07-08 ENCOUNTER — Telehealth: Payer: Self-pay

## 2020-07-08 NOTE — Telephone Encounter (Signed)
Pt said Meloxicam was making him "not see clear, but not blurry." He was told to stop meloxicam and switch to Aleve twice a day.

## 2020-10-01 ENCOUNTER — Encounter: Payer: Self-pay | Admitting: Family Medicine

## 2020-10-01 ENCOUNTER — Other Ambulatory Visit: Payer: Self-pay

## 2020-10-01 ENCOUNTER — Ambulatory Visit: Payer: Medicare PPO | Admitting: Family Medicine

## 2020-10-01 VITALS — BP 120/80 | HR 64 | Ht 71.0 in | Wt 213.0 lb

## 2020-10-01 DIAGNOSIS — H1011 Acute atopic conjunctivitis, right eye: Secondary | ICD-10-CM

## 2020-10-01 MED ORDER — OLOPATADINE HCL 0.2 % OP SOLN: 1.0000 [drp] | Freq: Two times a day (BID) | OPHTHALMIC | 0 refills | Status: DC

## 2020-10-01 MED ORDER — OLOPATADINE HCL 0.2 % OP SOLN: 1.0000 [drp] | Freq: Every day | OPHTHALMIC | 0 refills | Status: DC

## 2020-10-01 NOTE — Progress Notes (Signed)
Date:  10/01/2020   Name:  Jerome Gilmore   DOB:  [birth year]   MRN:  [id number]   Chief Complaint: eye irritation (Has noticed a place on inside of R) eye- been there for 2 weeks)  Eye Problem  The right eye is affected. This is a new problem. The current episode started in the past 7 days. The problem occurs constantly. The problem has been waxing and waning. There was no injury mechanism. The pain is mild. Associated symptoms include eye redness. Pertinent negatives include no blurred vision, eye discharge, double vision, fever, foreign body sensation, itching, nausea, photophobia, recent URI or vomiting. He has tried nothing for the symptoms.    Lab Results  Component Value Date   CREATININE 0.88 01/08/2020   BUN 15 01/08/2020   NA 138 01/08/2020   K 4.8 01/08/2020   CL 98 01/08/2020   CO2 28 01/08/2020   Lab Results  Component Value Date   CHOL 209 (H) 01/08/2020   HDL 71 01/08/2020   LDLCALC 103 (H) 01/08/2020   TRIG 210 (H) 01/08/2020   CHOLHDL 2.8 02/20/2018   No results found for: TSH No results found for: HGBA1C Lab Results  Component Value Date   WBC 4.8 01/08/2020   HGB 13.0 01/08/2020   HCT 37.6 01/08/2020   MCV 93 01/08/2020   PLT 187 01/08/2020   Lab Results  Component Value Date   ALT 8 01/08/2020   AST 13 01/08/2020   ALKPHOS 41 (L) 01/08/2020   BILITOT <0.2 01/08/2020     Review of Systems  Constitutional: Negative for chills and fever.  HENT: Negative for drooling, ear discharge, ear pain and sore throat.   Eyes: Positive for redness. Negative for blurred vision, double vision, photophobia, discharge and itching.  Respiratory: Negative for cough, shortness of breath and wheezing.   Cardiovascular: Negative for chest pain, palpitations and leg swelling.  Gastrointestinal: Negative for abdominal pain, blood in stool, constipation, diarrhea, nausea and vomiting.  Endocrine: Negative for polydipsia.  Genitourinary: Negative for dysuria, frequency,  hematuria and urgency.  Musculoskeletal: Negative for back pain, myalgias and neck pain.  Skin: Negative for rash.  Allergic/Immunologic: Negative for environmental allergies.  Neurological: Negative for dizziness and headaches.  Hematological: Does not bruise/bleed easily.  Psychiatric/Behavioral: Negative for suicidal ideas. The patient is not nervous/anxious.     Patient Active Problem List   Diagnosis Date Noted  . Arthritis 07/11/2019  . Taking medication for chronic disease 07/11/2019  . Primary osteoarthritis of both knees 05/24/2016  . Hyperlipidemia 11/25/2015  . Seizure disorder (HCC) 11/25/2015  . Primary osteoarthritis of right knee 11/25/2015  . Seizure (HCC) 12/03/2014  . Essential hypertension 11/26/2014    No Known Allergies  Past Surgical History:  Procedure Laterality Date  . RIB FRACTURE SURGERY      Social History   Tobacco Use  . Smoking status: Never Smoker  . Smokeless tobacco: Never Used  Vaping Use  . Vaping Use: Never used  Substance Use Topics  . Alcohol use: No    Alcohol/week: 0.0 standard drinks  . Drug use: No     Medication list has been reviewed and updated.  Current Meds  Medication Sig  . carbamazepine (TEGRETOL) 200 MG tablet Take 2 tablets (400 mg total) by mouth 3 (three) times daily.  . divalproex (DEPAKOTE) 500 MG DR tablet Take 1 tablet (500 mg total) by mouth 3 (three) times daily.  Marland Kitchen lisinopril-hydrochlorothiazide (ZESTORETIC) 10-12.5 MG tablet Take 1 tablet  by mouth daily.  Marland Kitchen lovastatin (MEVACOR) 40 MG tablet Take 1 tablet (40 mg total) by mouth at bedtime.  . meloxicam (MOBIC) 7.5 MG tablet Take 1 tablet (7.5 mg total) by mouth daily. 1 tablet daily    PHQ 2/9 Scores 10/01/2020 06/22/2020 01/08/2020 10/08/2019  PHQ - 2 Score 0 0 0 0  PHQ- 9 Score 0 0 0 -    GAD 7 : Generalized Anxiety Score 10/01/2020 06/22/2020 01/08/2020 07/11/2019  Nervous, Anxious, on Edge 0 0 0 0  Control/stop worrying 0 0 0 0  Worry too much -  different things 0 0 0 0  Trouble relaxing 0 0 0 0  Restless 0 0 0 0  Easily annoyed or irritable 0 0 0 0  Afraid - awful might happen 0 0 0 0  Total GAD 7 Score 0 0 0 0  Anxiety Difficulty - - Not difficult at all -    BP Readings from Last 3 Encounters:  10/01/20 120/80  06/22/20 120/80  01/08/20 116/82    Physical Exam Vitals and nursing note reviewed.  HENT:     Head: Normocephalic.     Right Ear: External ear normal.     Left Ear: External ear normal.     Nose: Nose normal.  Eyes:     General: Lids are normal. No scleral icterus.       Right eye: No discharge.        Left eye: No discharge.     Conjunctiva/sclera: Conjunctivae normal.     Pupils: Pupils are equal, round, and reactive to light.     Comments: Clear cyst/vesicular area noted on the lateral aspect of the right eye in the scleral area this is consistent with what I have seen with allergic conjunctivitis in the past it does not look like a pterygium or pinguecula.  Neck:     Thyroid: No thyromegaly.     Vascular: No JVD.     Trachea: No tracheal deviation.  Cardiovascular:     Rate and Rhythm: Normal rate and regular rhythm.     Heart sounds: Normal heart sounds. No murmur heard. No friction rub. No gallop.   Pulmonary:     Effort: No respiratory distress.     Breath sounds: Normal breath sounds. No wheezing or rales.  Abdominal:     General: Bowel sounds are normal.     Palpations: Abdomen is soft. There is no mass.     Tenderness: There is no abdominal tenderness. There is no guarding or rebound.  Musculoskeletal:        General: No tenderness. Normal range of motion.     Cervical back: Normal range of motion and neck supple.  Lymphadenopathy:     Cervical: No cervical adenopathy.  Skin:    General: Skin is warm.     Findings: No rash.  Neurological:     Mental Status: He is alert and oriented to person, place, and time.     Cranial Nerves: No cranial nerve deficit.     Deep Tendon Reflexes:  Reflexes are normal and symmetric.     Wt Readings from Last 3 Encounters:  10/01/20 213 lb (96.6 kg)  06/22/20 231 lb (104.8 kg)  01/08/20 (!) 207 lb (93.9 kg)    BP 120/80   Pulse 64   Ht 5\' 11"  (1.803 m)   Wt 213 lb (96.6 kg)   BMI 29.71 kg/m   Assessment and Plan: 1. Allergic conjunctivitis of right eye Patient  has an area of the eye that is stable but of sudden onset but this is during the height of the allergy season.  This is a clear almost vesicular area of the lateral aspect of the right sclera.  It does not look like a pterygium or a pinguecula but more like an allergic conjunctivitis manifestation.  For that we will start on Patanol ophthalmic drops 0.2% solution apply 1 drop to each eye once a day.  Patient will call if this seems to be getting worse or progressing and next that we will have ophthalmology take a look.

## 2020-10-06 ENCOUNTER — Telehealth: Payer: Self-pay | Admitting: Family Medicine

## 2020-10-06 NOTE — Telephone Encounter (Signed)
Pt is calling to let dr Yetta Barre know he is still having issue with right eye conjunctivitis . Pt states he did not get rx he got eye drops over the counter. Pt thinks the reason pharm did not give him olopatadine rx due to insurance will not pay. Pt seen dr Yetta Barre on 10-01-2020. Pt is having some eye relief. Pt said the knot is still in his right  Eye. Pt is going call his eye doctor because they gave him regular glasses instead of his usual reading glasses.

## 2020-10-06 NOTE — Telephone Encounter (Signed)
I tried to call pt 3 times. He needs to go to his eye doctor, if not better.

## 2020-10-11 ENCOUNTER — Ambulatory Visit (INDEPENDENT_AMBULATORY_CARE_PROVIDER_SITE_OTHER): Payer: Medicare PPO

## 2020-10-11 DIAGNOSIS — Z0000 Encounter for general adult medical examination without abnormal findings: Secondary | ICD-10-CM | POA: Diagnosis not present

## 2020-10-11 NOTE — Patient Instructions (Signed)
Mr. Jerome Gilmore , Thank you for taking time to come for your Medicare Wellness Visit. I appreciate your ongoing commitment to your health goals. Please review the following plan we discussed and let me know if I can assist you in the future.   Screening recommendations/referrals: Colonoscopy: done 11/25/15. Repeat in 2027 Recommended yearly ophthalmology/optometry visit for glaucoma screening and checkup Recommended yearly dental visit for hygiene and checkup  Vaccinations: Influenza vaccine: done 04/12/20 Pneumococcal vaccine: due Tdap vaccine: done 05/24/16 Shingles vaccine: Shingrix discussed. Please contact your pharmacy for coverage information.  Covid-19: done 09/10/19, 10/01/19 & 04/01/20  Conditions/risks identified: Recommend increasing physical activity   Next appointment: Follow up in one year for your annual wellness visit.   Preventive Care 58 Years and Older, Male Preventive care refers to lifestyle choices and visits with your health care provider that can promote health and wellness. What does preventive care include?  A yearly physical exam. This is also called an annual well check.  Dental exams once or twice a year.  Routine eye exams. Ask your health care provider how often you should have your eyes checked.  Personal lifestyle choices, including:  Daily care of your teeth and gums.  Regular physical activity.  Eating a healthy diet.  Avoiding tobacco and drug use.  Limiting alcohol use.  Practicing safe sex.  Taking low doses of aspirin every day.  Taking vitamin and mineral supplements as recommended by your health care provider. What happens during an annual well check? The services and screenings done by your health care provider during your annual well check will depend on your age, overall health, lifestyle risk factors, and family history of disease. Counseling  Your health care provider may ask you questions about your:  Alcohol use.  Tobacco  use.  Drug use.  Emotional well-being.  Home and relationship well-being.  Sexual activity.  Eating habits.  History of falls.  Memory and ability to understand (cognition).  Work and work Astronomer. Screening  You may have the following tests or measurements:  Height, weight, and BMI.  Blood pressure.  Lipid and cholesterol levels. These may be checked every 5 years, or more frequently if you are over 52 years old.  Skin check.  Lung cancer screening. You may have this screening every year starting at age 58 if you have a 30-pack-year history of smoking and currently smoke or have quit within the past 15 years.  Fecal occult blood test (FOBT) of the stool. You may have this test every year starting at age 56.  Flexible sigmoidoscopy or colonoscopy. You may have a sigmoidoscopy every 5 years or a colonoscopy every 10 years starting at age 6.  Prostate cancer screening. Recommendations will vary depending on your family history and other risks.  Hepatitis C blood test.  Hepatitis B blood test.  Sexually transmitted disease (STD) testing.  Diabetes screening. This is done by checking your blood sugar (glucose) after you have not eaten for a while (fasting). You may have this done every 1-3 years.  Abdominal aortic aneurysm (AAA) screening. You may need this if you are a current or former smoker.  Osteoporosis. You may be screened starting at age 41 if you are at high risk. Talk with your health care provider about your test results, treatment options, and if necessary, the need for more tests. Vaccines  Your health care provider may recommend certain vaccines, such as:  Influenza vaccine. This is recommended every year.  Tetanus, diphtheria, and acellular pertussis (Tdap,  Td) vaccine. You may need a Td booster every 10 years.  Zoster vaccine. You may need this after age 63.  Pneumococcal 13-valent conjugate (PCV13) vaccine. One dose is recommended after age  58.  Pneumococcal polysaccharide (PPSV23) vaccine. One dose is recommended after age 58. Talk to your health care provider about which screenings and vaccines you need and how often you need them. This information is not intended to replace advice given to you by your health care provider. Make sure you discuss any questions you have with your health care provider. Document Released: 06/25/2015 Document Revised: 02/16/2016 Document Reviewed: 03/30/2015 Elsevier Interactive Patient Education  2017 [place] Prevention in the Home Falls can cause injuries. They can happen to people of all ages. There are many things you can do to make your home safe and to help prevent falls. What can I do on the outside of my home?  Regularly fix the edges of walkways and driveways and fix any cracks.  Remove anything that might make you trip as you walk through a door, such as a raised step or threshold.  Trim any bushes or trees on the path to your home.  Use bright outdoor lighting.  Clear any walking paths of anything that might make someone trip, such as rocks or tools.  Regularly check to see if handrails are loose or broken. Make sure that both sides of any steps have handrails.  Any raised decks and porches should have guardrails on the edges.  Have any leaves, snow, or ice cleared regularly.  Use sand or salt on walking paths during winter.  Clean up any spills in your garage right away. This includes oil or grease spills. What can I do in the bathroom?  Use night lights.  Install grab bars by the toilet and in the tub and shower. Do not use towel bars as grab bars.  Use non-skid mats or decals in the tub or shower.  If you need to sit down in the shower, use a plastic, non-slip stool.  Keep the floor dry. Clean up any water that spills on the floor as soon as it happens.  Remove soap buildup in the tub or shower regularly.  Attach bath mats securely with double-sided  non-slip rug tape.  Do not have throw rugs and other things on the floor that can make you trip. What can I do in the bedroom?  Use night lights.  Make sure that you have a light by your bed that is easy to reach.  Do not use any sheets or blankets that are too big for your bed. They should not hang down onto the floor.  Have a firm chair that has side arms. You can use this for support while you get dressed.  Do not have throw rugs and other things on the floor that can make you trip. What can I do in the kitchen?  Clean up any spills right away.  Avoid walking on wet floors.  Keep items that you use a lot in easy-to-reach places.  If you need to reach something above you, use a strong step stool that has a grab bar.  Keep electrical cords out of the way.  Do not use floor polish or wax that makes floors slippery. If you must use wax, use non-skid floor wax.  Do not have throw rugs and other things on the floor that can make you trip. What can I do with my stairs?  Do not  leave any items on the stairs.  Make sure that there are handrails on both sides of the stairs and use them. Fix handrails that are broken or loose. Make sure that handrails are as long as the stairways.  Check any carpeting to make sure that it is firmly attached to the stairs. Fix any carpet that is loose or worn.  Avoid having throw rugs at the top or bottom of the stairs. If you do have throw rugs, attach them to the floor with carpet tape.  Make sure that you have a light switch at the top of the stairs and the bottom of the stairs. If you do not have them, ask someone to add them for you. What else can I do to help prevent falls?  Wear shoes that:  Do not have high heels.  Have rubber bottoms.  Are comfortable and fit you well.  Are closed at the toe. Do not wear sandals.  If you use a stepladder:  Make sure that it is fully opened. Do not climb a closed stepladder.  Make sure that both  sides of the stepladder are locked into place.  Ask someone to hold it for you, if possible.  Clearly mark and make sure that you can see:  Any grab bars or handrails.  First and last steps.  Where the edge of each step is.  Use tools that help you move around (mobility aids) if they are needed. These include:  Canes.  Walkers.  Scooters.  Crutches.  Turn on the lights when you go into a dark area. Replace any light bulbs as soon as they burn out.  Set up your furniture so you have a clear path. Avoid moving your furniture around.  If any of your floors are uneven, fix them.  If there are any pets around you, be aware of where they are.  Review your medicines with your doctor. Some medicines can make you feel dizzy. This can increase your chance of falling. Ask your doctor what other things that you can do to help prevent falls. This information is not intended to replace advice given to you by your health care provider. Make sure you discuss any questions you have with your health care provider. Document Released: 03/25/2009 Document Revised: 11/04/2015 Document Reviewed: 07/03/2014 Elsevier Interactive Patient Education  2017 [employer].

## 2020-10-11 NOTE — Progress Notes (Signed)
Subjective:   Jerome Gilmore is a 60 y.o. male who presents for Medicare Annual/Subsequent preventive examination.  Virtual Visit via Telephone Note  I connected with  Jerome Gilmore on 10/11/20 at  3:20 PM EDT by telephone and verified that I am speaking with the correct person using two identifiers.  Location: Patient: home Provider: [facility] Persons participating in the virtual visit: patient/Nurse Health Advisor   I discussed the limitations, risks, security and privacy concerns of performing an evaluation and management service by telephone and the availability of in person appointments. The patient expressed understanding and agreed to proceed.  Interactive audio and video telecommunications were attempted between this nurse and patient, however failed, due to patient having technical difficulties OR patient did not have access to video capability.  We continued and completed visit with audio only.  Some vital signs may be absent or patient reported.   Reather Littler, LPN   Review of Systems     Cardiac Risk Factors include: advanced age (>79men, >53 women);dyslipidemia;hypertension;male gender     Objective:    There were no vitals filed for this visit. There is no height or weight on file to calculate BMI.  Advanced Directives 10/08/2019 04/07/2018  Does Patient Have a Medical Advance Directive? No No  Would patient like information on creating a medical advance directive? No - Patient declined No - Patient declined    Current Medications (verified) Outpatient Encounter Medications as of 10/11/2020  Medication Sig  . carbamazepine (TEGRETOL) 200 MG tablet Take 2 tablets (400 mg total) by mouth 3 (three) times daily.  . divalproex (DEPAKOTE) 500 MG DR tablet Take 1 tablet (500 mg total) by mouth 3 (three) times daily.  Marland Kitchen lisinopril-hydrochlorothiazide (ZESTORETIC) 10-12.5 MG tablet Take 1 tablet by mouth daily.  Marland Kitchen lovastatin (MEVACOR) 40 MG tablet Take 1 tablet (40 mg total)  by mouth at bedtime.  . Olopatadine HCl 0.2 % SOLN Apply 1 drop to eye daily.  . [DISCONTINUED] meloxicam (MOBIC) 7.5 MG tablet Take 1 tablet (7.5 mg total) by mouth daily. 1 tablet daily   No facility-administered encounter medications on file as of 10/11/2020.    Allergies (verified) Patient has no known allergies.   History: Past Medical History:  Diagnosis Date  . Hyperlipidemia   . Hypertension   . Joint pain   . Seizures (HCC)    Past Surgical History:  Procedure Laterality Date  . RIB FRACTURE SURGERY     History reviewed. No pertinent family history. Social History   Socioeconomic History  . Marital status: Single    Spouse name: Not on file  . Number of children: Not on file  . Years of education: Not on file  . Highest education level: Not on file  Occupational History  . Not on file  Tobacco Use  . Smoking status: Never Smoker  . Smokeless tobacco: Never Used  Vaping Use  . Vaping Use: Never used  Substance and Sexual Activity  . Alcohol use: No    Alcohol/week: 0.0 standard drinks  . Drug use: No  . Sexual activity: Not Currently  Other Topics Concern  . Not on file  Social History Narrative   Pt lives alone   Social Determinants of Health   Financial Resource Strain: Low Risk   . Difficulty of Paying Living Expenses: Not hard at all  Food Insecurity: No Food Insecurity  . Worried About Programme researcher, broadcasting/film/video in the Last Year: Never true  . Ran Out of Food in  the Last Year: Never true  Transportation Needs: No Transportation Needs  . Lack of Transportation (Medical): No  . Lack of Transportation (Non-Medical): No  Physical Activity: Inactive  . Days of Exercise per Week: 0 days  . Minutes of Exercise per Session: 0 min  Stress: No Stress Concern Present  . Feeling of Stress : Not at all  Social Connections: Moderately Isolated  . Frequency of Communication with Friends and Family: More than three times a week  . Frequency of Social Gatherings  with Friends and Family: More than three times a week  . Attends Religious Services: More than 4 times per year  . Active Member of Clubs or Organizations: No  . Attends BankerClub or Organization Meetings: Never  . Marital Status: Never married    Tobacco Counseling Counseling given: Not Answered   Clinical Intake:  Pre-visit preparation completed: Yes  Pain : No/denies pain     Nutritional Risks: None Diabetes: No  How often do you need to have someone help you when you read instructions, pamphlets, or other written materials from your doctor or pharmacy?: 1 - Never    Interpreter Needed?: No  Information entered by :: Reather LittlerKasey Santonio Speakman LPN   Activities of Daily Living In your present state of health, do you have any difficulty performing the following activities: 10/11/2020  Hearing? Y  Comment declines hearing aids  Vision? N  Difficulty concentrating or making decisions? Y  Walking or climbing stairs? N  Dressing or bathing? N  Doing errands, shopping? N  Preparing Food and eating ? N  Using the Toilet? N  In the past six months, have you accidently leaked urine? N  Do you have problems with loss of bowel control? N  Managing your Medications? N  Managing your Finances? N  Some recent data might be hidden    Patient Care Team: Duanne LimerickJones, Deanna C, MD as PCP - General (Family Medicine)  Indicate any recent Medical Services you may have received from other than [facility] providers in the past year (date may be approximate).     Assessment:   This is a routine wellness examination for Jerome Gilmore.  Hearing/Vision screen  Hearing Screening   125Hz  250Hz  500Hz  1000Hz  2000Hz  3000Hz  4000Hz  6000Hz  8000Hz   Right ear:           Left ear:           Comments: Pt states mild hearing difficulty in right ear  Vision Screening Comments: [facility] screen[facility]  Dietary issues and exercise activities discussed: Current Exercise Habits: The patient does not  participate in regular exercise at present, Exercise limited by: orthopedic condition(s)  Goals Addressed   None    Depression Screen PHQ 2/9 Scores 10/11/2020 10/01/2020 06/22/2020 01/08/2020 10/08/2019 07/11/2019 01/31/2019  PHQ - 2 Score 0 0 0 0 0 0 0  PHQ- 9 Score - 0 0 0 - 0 0    Fall Risk Fall Risk  10/11/2020 01/08/2020 10/08/2019 07/11/2019 05/28/2017  Falls in the past year? 0 0 0 0 No  Number falls in past yr: 0 - 0 - -  Injury with Fall? 0 - 0 - -  Risk for fall due to : No Fall Risks No Fall Risks No Fall Risks - -  Follow up Falls prevention discussed Falls evaluation completed Falls prevention discussed Falls evaluation completed -    FALL RISK PREVENTION PERTAINING TO THE HOME:  Any stairs in or around the home? No  If so, are there any without handrails? No  Home free of loose throw rugs in walkways, pet beds, electrical cords, etc? Yes  Adequate lighting in your home to reduce risk of falls? Yes   ASSISTIVE DEVICES UTILIZED TO PREVENT FALLS:  Life alert? No  Use of a cane, walker or w/c? No  Grab bars in the bathroom? Yes  Shower chair or bench in shower? No  Elevated toilet seat or a handicapped toilet? No   TIMED UP AND GO:  Was the test performed? No . Telephonic visit.   Cognitive Function: Pt declined 6CIT for 2022 AWV.         Immunizations Immunization History  Administered Date(s) Administered  . Influenza,inj,quad, With Preservative 04/04/2017, 04/29/2018  . Influenza-Unspecified 04/12/2017, 04/13/2019, 04/12/2020  . PFIZER(Purple Top)SARS-COV-2 Vaccination 09/10/2019, 10/01/2019, 04/01/2020  . Tdap 05/24/2016    TDAP status: Up to date  Flu Vaccine status: Up to date  Pneumococcal vaccine status: Due, Education has been provided regarding the importance of this vaccine. Advised may receive this vaccine at local pharmacy or Health Dept. Aware to provide a copy of the vaccination record if obtained from local pharmacy or Health Dept. Verbalized  acceptance and understanding.  Covid-19 vaccine status: Completed vaccines  Qualifies for Shingles Vaccine? Yes   Zostavax completed No   Shingrix Completed?: No.    Education has been provided regarding the importance of this vaccine. Patient has been advised to call insurance company to determine out of pocket expense if they have not yet received this vaccine. Advised may also receive vaccine at local pharmacy or Health Dept. Verbalized acceptance and understanding.  Screening Tests Health Maintenance  Topic Date Due  . HIV Screening  06/22/2021 (Originally 10/16/1975)  . INFLUENZA VACCINE  01/10/2021  . COLONOSCOPY (Pts 45-66yrs Insurance coverage will need to be confirmed)  11/24/2025  . TETANUS/TDAP  05/24/2026  . COVID-19 Vaccine  Completed  . Hepatitis C Screening  Completed  . HPV VACCINES  Aged Out    Health Maintenance  There are no preventive care reminders to display for this patient.  Colorectal cancer screening: Type of screening: Colonoscopy. Completed 11/25/15. Repeat every 10 years  Lung Cancer Screening: (Low Dose CT Chest recommended if Age 29-80 years, 30 pack-year currently smoking OR have quit w/in 15years.) does not qualify.   Additional Screening:  Hepatitis C Screening: does qualify; Completed 05/24/16.   Vision Screening: Recommended annual ophthalmology exams for early detection of glaucoma and other disorders of the eye. Is the patient up to date with their annual eye exam?  Yes  Who is the provider or what is the name of the office in which the patient attends annual eye exams? [facility]  Dental Screening: Recommended annual dental exams for proper oral hygiene  Community Resource Referral / Chronic Care Management: CRR required this visit?  No   CCM required this visit?  No      Plan:     I have personally reviewed and noted the following in the patient's chart:   . Medical and social history . Use of alcohol, tobacco or illicit  drugs  . Current medications and supplements including opioid prescriptions. Patient is not currently taking opioid prescriptions. . Functional ability and status . Nutritional status . Physical activity . Advanced directives . List of other physicians . Hospitalizations, surgeries, and ER visits in previous 12 months . Vitals . Screenings to include cognitive, depression, and falls . Referrals and appointments  In addition, I  have reviewed and discussed with patient certain preventive protocols, quality metrics, and best practice recommendations. A written personalized care plan for preventive services as well as general preventive health recommendations were provided to patient.     Reather Littler, LPN   01/11/9561   Nurse Notes: none

## 2020-11-01 ENCOUNTER — Encounter: Payer: Self-pay | Admitting: Family Medicine

## 2020-11-01 ENCOUNTER — Ambulatory Visit: Payer: Medicare PPO | Admitting: Family Medicine

## 2020-11-01 ENCOUNTER — Other Ambulatory Visit: Payer: Self-pay

## 2020-11-01 VITALS — BP 120/88 | HR 80 | Ht 71.0 in | Wt 212.0 lb

## 2020-11-01 DIAGNOSIS — L255 Unspecified contact dermatitis due to plants, except food: Secondary | ICD-10-CM

## 2020-11-01 MED ORDER — PREDNISONE 10 MG PO TABS
ORAL_TABLET | ORAL | 0 refills | Status: DC
Start: 2020-11-01 — End: 2020-12-21

## 2020-11-01 MED ORDER — HYDROXYZINE HCL 10 MG PO TABS: 10.0000 mg | ORAL_TABLET | Freq: Three times a day (TID) | ORAL | 0 refills | Status: DC | PRN

## 2020-11-01 NOTE — Patient Instructions (Signed)
Contact Dermatitis Dermatitis is redness, soreness, and swelling (inflammation) of the skin. Contact dermatitis is a reaction to something that touches the skin. There are two types of contact dermatitis:  Irritant contact dermatitis. This happens when something bothers (irritates) your skin, like soap.  Allergic contact dermatitis. This is caused when you are exposed to something that you are allergic to, such as poison ivy. What are the causes?  Common causes of irritant contact dermatitis include: ? Makeup. ? Soaps. ? Detergents. ? Bleaches. ? Acids. ? Metals, such as nickel.  Common causes of allergic contact dermatitis include: ? Plants. ? Chemicals. ? Jewelry. ? Latex. ? Medicines. ? Preservatives in products, such as clothing. What increases the risk?  Having a job that exposes you to things that bother your skin.  Having asthma or eczema. What are the signs or symptoms? Symptoms may happen anywhere the irritant has touched your skin. Symptoms include:  Dry or flaky skin.  Redness.  Cracks.  Itching.  Pain or a burning feeling.  Blisters.  Blood or clear fluid draining from skin cracks. With allergic contact dermatitis, swelling may occur. This may happen in places such as the eyelids, mouth, or genitals.   How is this treated?  This condition is treated by checking for the cause of the reaction and protecting your skin. Treatment may also include: ? Steroid creams, ointments, or medicines. ? Antibiotic medicines or other ointments, if you have a skin infection. ? Lotion or medicines to help with itching. ? A bandage (dressing). Follow these instructions at home: Skin care  Moisturize your skin as needed.  Put cool cloths on your skin.  Put a baking soda paste on your skin. Stir water into baking soda until it looks like a paste.  Do not scratch your skin.  Avoid having things rub up against your skin.  Avoid the use of soaps, perfumes, and  dyes. Medicines  Take or apply over-the-counter and prescription medicines only as told by your doctor.  If you were prescribed an antibiotic medicine, take or apply it as told by your doctor. Do not stop using it even if your condition starts to get better. Bathing  Take a bath with: ? Epsom salts. ? Baking soda. ? Colloidal oatmeal.  Bathe less often.  Bathe in warm water. Avoid using hot water. Bandage care  If you were given a bandage, change it as told by your health care provider.  Wash your hands with soap and water before and after you change your bandage. If soap and water are not available, use hand sanitizer. General instructions  Avoid the things that caused your reaction. If you do not know what caused it, keep a journal. Write down: ? What you eat. ? What skin products you use. ? What you drink. ? What you wear in the area that has symptoms. This includes jewelry.  Check the affected areas every day for signs of infection. Check for: ? More redness, swelling, or pain. ? More fluid or blood. ? Warmth. ? Pus or a bad smell.  Keep all follow-up visits as told by your doctor. This is important. Contact a doctor if:  You do not get better with treatment.  Your condition gets worse.  You have signs of infection, such as: ? More swelling. ? Tenderness. ? More redness. ? Soreness. ? Warmth.  You have a fever.  You have new symptoms. Get help right away if:  You have a very bad headache.  You have   neck pain.  Your neck is stiff.  You throw up (vomit).  You feel very sleepy.  You see red streaks coming from the area.  Your bone or joint near the area hurts after the skin has healed.  The area turns darker.  You have trouble breathing. Summary  Dermatitis is redness, soreness, and swelling of the skin.  Symptoms may occur where the irritant has touched you.  Treatment may include medicines and skin care.  If you do not know what caused  your reaction, keep a journal.  Contact a doctor if your condition gets worse or you have signs of infection. This information is not intended to replace advice given to you by your health care provider. Make sure you discuss any questions you have with your health care provider. Document Revised: 09/18/2018 Document Reviewed: 12/12/2017 Elsevier Patient Education  2021 Elsevier Inc.  

## 2020-11-01 NOTE — Progress Notes (Signed)
Date:  11/01/2020   Name:  Khamauri Bauernfeind   DOB:  09-Dec-1960   MRN:  [id number]   Chief Complaint: Rash (Working outside- has itchy rash on hands and arms)  Rash This is a new problem. The current episode started in the past 7 days. The problem has been gradually worsening since onset. The affected locations include the left wrist, left hand, left elbow, right elbow, right arm, left arm, right hand, right wrist and right fingers. The rash is characterized by redness and itchiness (koebner). He was exposed to plant contact. Past treatments include topical steroids and anti-itch cream. The treatment provided no relief.    Lab Results  Component Value Date   CREATININE 0.88 01/08/2020   BUN 15 01/08/2020   NA 138 01/08/2020   K 4.8 01/08/2020   CL 98 01/08/2020   CO2 28 01/08/2020   Lab Results  Component Value Date   CHOL 209 (H) 01/08/2020   HDL 71 01/08/2020   LDLCALC 103 (H) 01/08/2020   TRIG 210 (H) 01/08/2020   CHOLHDL 2.8 02/20/2018   No results found for: TSH No results found for: HGBA1C Lab Results  Component Value Date   WBC 4.8 01/08/2020   HGB 13.0 01/08/2020   HCT 37.6 01/08/2020   MCV 93 01/08/2020   PLT 187 01/08/2020   Lab Results  Component Value Date   ALT 8 01/08/2020   AST 13 01/08/2020   ALKPHOS 41 (L) 01/08/2020   BILITOT <0.2 01/08/2020     Review of Systems  Skin: Positive for rash.    Patient Active Problem List   Diagnosis Date Noted  . Arthritis 07/11/2019  . Taking medication for chronic disease 07/11/2019  . Primary osteoarthritis of both knees 05/24/2016  . Hyperlipidemia 11/25/2015  . Seizure disorder (HCC) 11/25/2015  . Primary osteoarthritis of right knee 11/25/2015  . Seizure (HCC) 12/03/2014  . Essential hypertension 11/26/2014    No Known Allergies  Past Surgical History:  Procedure Laterality Date  . RIB FRACTURE SURGERY      Social History   Tobacco Use  . Smoking status: Never Smoker  . Smokeless tobacco:  Never Used  Vaping Use  . Vaping Use: Never used  Substance Use Topics  . Alcohol use: No    Alcohol/week: 0.0 standard drinks  . Drug use: No     Medication list has been reviewed and updated.  Current Meds  Medication Sig  . carbamazepine (TEGRETOL) 200 MG tablet Take 2 tablets (400 mg total) by mouth 3 (three) times daily.  . divalproex (DEPAKOTE) 500 MG DR tablet Take 1 tablet (500 mg total) by mouth 3 (three) times daily.  Marland Kitchen lisinopril-hydrochlorothiazide (ZESTORETIC) 10-12.5 MG tablet Take 1 tablet by mouth daily.  Marland Kitchen lovastatin (MEVACOR) 40 MG tablet Take 1 tablet (40 mg total) by mouth at bedtime.  . Olopatadine HCl 0.2 % SOLN Apply 1 drop to eye daily.    PHQ 2/9 Scores 10/11/2020 10/01/2020 06/22/2020 01/08/2020  PHQ - 2 Score 0 0 0 0  PHQ- 9 Score - 0 0 0    GAD 7 : Generalized Anxiety Score 10/01/2020 06/22/2020 01/08/2020 07/11/2019  Nervous, Anxious, on Edge 0 0 0 0  Control/stop worrying 0 0 0 0  Worry too much - different things 0 0 0 0  Trouble relaxing 0 0 0 0  Restless 0 0 0 0  Easily annoyed or irritable 0 0 0 0  Afraid - awful might happen 0 0 0  0  Total GAD 7 Score 0 0 0 0  Anxiety Difficulty - - Not difficult at all -    BP Readings from Last 3 Encounters:  11/01/20 120/88  10/01/20 120/80  06/22/20 120/80    Physical Exam  Wt Readings from Last 3 Encounters:  11/01/20 212 lb (96.2 kg)  10/01/20 213 lb (96.6 kg)  06/22/20 231 lb (104.8 kg)    BP 120/88   Pulse 80   Ht 5\' 11"  (1.803 m)   Wt 212 lb (96.2 kg)   BMI 29.57 kg/m   Assessment and Plan:  1. Contact dermatitis due to plant New onset.  Persistent.  Patient's been doing yard work and developed a rash over his hands fingers and lower arms bilateral.  This is consistent with a contact dermatitis to plant presumably poison oak/poison ivy.  We will treat with prednisone taper beginning at 60 mg and tapering over 2-week.  Patient is also been given some Atarax to take at night for the  itching and to help sleep. - predniSONE (DELTASONE) 10 MG tablet; Taper 6,6,6,5,5,5,4,4,3,3,2,2,1,1  Dispense: 53 tablet; Refill: 0 - hydrOXYzine (ATARAX/VISTARIL) 10 MG tablet; Take 1 tablet (10 mg total) by mouth 3 (three) times daily as needed (itch/rash).  Dispense: 30 tablet; Refill: 0

## 2020-11-18 DIAGNOSIS — G629 Polyneuropathy, unspecified: Secondary | ICD-10-CM | POA: Diagnosis not present

## 2020-11-18 DIAGNOSIS — Z6829 Body mass index (BMI) 29.0-29.9, adult: Secondary | ICD-10-CM | POA: Diagnosis not present

## 2020-11-18 DIAGNOSIS — G40909 Epilepsy, unspecified, not intractable, without status epilepticus: Secondary | ICD-10-CM | POA: Diagnosis not present

## 2020-11-18 DIAGNOSIS — I10 Essential (primary) hypertension: Secondary | ICD-10-CM | POA: Diagnosis not present

## 2020-11-18 DIAGNOSIS — E663 Overweight: Secondary | ICD-10-CM | POA: Diagnosis not present

## 2020-11-18 DIAGNOSIS — E785 Hyperlipidemia, unspecified: Secondary | ICD-10-CM | POA: Diagnosis not present

## 2020-11-18 DIAGNOSIS — G8929 Other chronic pain: Secondary | ICD-10-CM | POA: Diagnosis not present

## 2020-11-18 DIAGNOSIS — M1990 Unspecified osteoarthritis, unspecified site: Secondary | ICD-10-CM | POA: Diagnosis not present

## 2020-11-18 DIAGNOSIS — M545 Low back pain, unspecified: Secondary | ICD-10-CM | POA: Diagnosis not present

## 2020-12-21 ENCOUNTER — Ambulatory Visit: Payer: Medicare PPO | Admitting: Family Medicine

## 2020-12-21 ENCOUNTER — Encounter: Payer: Self-pay | Admitting: Family Medicine

## 2020-12-21 ENCOUNTER — Other Ambulatory Visit: Payer: Self-pay

## 2020-12-21 VITALS — BP 120/70 | HR 76 | Ht 71.0 in | Wt 212.0 lb

## 2020-12-21 DIAGNOSIS — R69 Illness, unspecified: Secondary | ICD-10-CM

## 2020-12-21 DIAGNOSIS — G40909 Epilepsy, unspecified, not intractable, without status epilepticus: Secondary | ICD-10-CM

## 2020-12-21 DIAGNOSIS — M1990 Unspecified osteoarthritis, unspecified site: Secondary | ICD-10-CM | POA: Diagnosis not present

## 2020-12-21 DIAGNOSIS — I10 Essential (primary) hypertension: Secondary | ICD-10-CM

## 2020-12-21 DIAGNOSIS — E7849 Other hyperlipidemia: Secondary | ICD-10-CM

## 2020-12-21 MED ORDER — NAPROXEN 250 MG PO TABS: 250.0000 mg | ORAL_TABLET | Freq: Two times a day (BID) | ORAL | 1 refills | Status: DC

## 2020-12-21 MED ORDER — CARBAMAZEPINE 200 MG PO TABS: 400.0000 mg | ORAL_TABLET | Freq: Three times a day (TID) | ORAL | 1 refills | Status: DC

## 2020-12-21 MED ORDER — DIVALPROEX SODIUM 500 MG PO DR TAB: 500.0000 mg | DELAYED_RELEASE_TABLET | Freq: Three times a day (TID) | ORAL | 1 refills | Status: DC

## 2020-12-21 MED ORDER — LOVASTATIN 40 MG PO TABS: 40.0000 mg | ORAL_TABLET | Freq: Every day | ORAL | 1 refills | Status: DC

## 2020-12-21 MED ORDER — LISINOPRIL-HYDROCHLOROTHIAZIDE 10-12.5 MG PO TABS: 1.0000 | ORAL_TABLET | Freq: Every day | ORAL | 1 refills | Status: DC

## 2020-12-21 NOTE — Progress Notes (Signed)
Date:  12/21/2020   Name:  Jerome Gilmore   DOB:  01-12-1961   MRN:  [id number]   Chief Complaint: Hypertension, Hyperlipidemia, and Seizures  Hypertension This is a chronic problem. The current episode started more than 1 year ago. The problem has been gradually improving since onset. The problem is controlled. Pertinent negatives include no anxiety, blurred vision, chest pain, headaches, malaise/fatigue, neck pain, orthopnea, palpitations, peripheral edema, PND, shortness of breath or sweats. Past treatments include ACE inhibitors and diuretics. The current treatment provides moderate improvement. There are no compliance problems.  There is no history of CAD/MI or CVA. There is no history of chronic renal disease.  Hyperlipidemia This is a chronic problem. The current episode started more than 1 year ago. Recent lipid tests were reviewed and are normal. He has no history of chronic renal disease or diabetes. Pertinent negatives include no chest pain, focal sensory loss, focal weakness, leg pain, myalgias or shortness of breath. Current antihyperlipidemic treatment includes statins. The current treatment provides moderate improvement of lipids. There are no compliance problems.  Risk factors for coronary artery disease include dyslipidemia and hypertension.  Seizures  This is a chronic (controlled) problem. The problem has been gradually improving. Pertinent negatives include no headaches, no sore throat, no chest pain, no cough, no nausea and no diarrhea.  Arthritis Presents for follow-up visit. He reports no pain or stiffness. The symptoms have been improving. Affected location: generalized/back. Pertinent negatives include no diarrhea, dysuria, fever or rash.   Lab Results  Component Value Date   CREATININE 0.88 01/08/2020   BUN 15 01/08/2020   NA 138 01/08/2020   K 4.8 01/08/2020   CL 98 01/08/2020   CO2 28 01/08/2020   Lab Results  Component Value Date   CHOL 209 (H) 01/08/2020    HDL 71 01/08/2020   LDLCALC 103 (H) 01/08/2020   TRIG 210 (H) 01/08/2020   CHOLHDL 2.8 02/20/2018   No results found for: TSH No results found for: HGBA1C Lab Results  Component Value Date   WBC 4.8 01/08/2020   HGB 13.0 01/08/2020   HCT 37.6 01/08/2020   MCV 93 01/08/2020   PLT 187 01/08/2020   Lab Results  Component Value Date   ALT 8 01/08/2020   AST 13 01/08/2020   ALKPHOS 41 (L) 01/08/2020   BILITOT <0.2 01/08/2020     Review of Systems  Constitutional:  Negative for chills, fever and malaise/fatigue.  HENT:  Negative for drooling, ear discharge, ear pain and sore throat.   Eyes:  Negative for blurred vision.  Respiratory:  Negative for cough, shortness of breath and wheezing.   Cardiovascular:  Negative for chest pain, palpitations, orthopnea, leg swelling and PND.  Gastrointestinal:  Negative for abdominal pain, blood in stool, constipation, diarrhea and nausea.  Endocrine: Negative for polydipsia.  Genitourinary:  Negative for dysuria, frequency, hematuria and urgency.  Musculoskeletal:  Positive for arthritis. Negative for back pain, myalgias, neck pain and stiffness.  Skin:  Negative for rash.  Allergic/Immunologic: Negative for environmental allergies.  Neurological:  Positive for seizures. Negative for dizziness, focal weakness and headaches.  Hematological:  Does not bruise/bleed easily.  Psychiatric/Behavioral:  Negative for suicidal ideas. The patient is not nervous/anxious.    Patient Active Problem List   Diagnosis Date Noted   Arthritis 07/11/2019   Taking medication for chronic disease 07/11/2019   Primary osteoarthritis of both knees 05/24/2016   Hyperlipidemia 11/25/2015   Seizure disorder (HCC) 11/25/2015   Primary  osteoarthritis of right knee 11/25/2015   Seizure (HCC) 12/03/2014   Essential hypertension 11/26/2014    No Known Allergies  Past Surgical History:  Procedure Laterality Date   RIB FRACTURE SURGERY      Social History    Tobacco Use   Smoking status: Never   Smokeless tobacco: Never  Vaping Use   Vaping Use: Never used  Substance Use Topics   Alcohol use: No    Alcohol/week: 0.0 standard drinks   Drug use: No     Medication list has been reviewed and updated.  Current Meds  Medication Sig   carbamazepine (TEGRETOL) 200 MG tablet Take 2 tablets (400 mg total) by mouth 3 (three) times daily.   divalproex (DEPAKOTE) 500 MG DR tablet Take 1 tablet (500 mg total) by mouth 3 (three) times daily.   lisinopril-hydrochlorothiazide (ZESTORETIC) 10-12.5 MG tablet Take 1 tablet by mouth daily.   lovastatin (MEVACOR) 40 MG tablet Take 1 tablet (40 mg total) by mouth at bedtime.   Olopatadine HCl 0.2 % SOLN Apply 1 drop to eye daily.   [DISCONTINUED] hydrOXYzine (ATARAX/VISTARIL) 10 MG tablet Take 1 tablet (10 mg total) by mouth 3 (three) times daily as needed (itch/rash).    PHQ 2/9 Scores 12/21/2020 10/11/2020 10/01/2020 06/22/2020  PHQ - 2 Score 0 0 0 0  PHQ- 9 Score 0 - 0 0    GAD 7 : Generalized Anxiety Score 12/21/2020 10/01/2020 06/22/2020 01/08/2020  Nervous, Anxious, on Edge 0 0 0 0  Control/stop worrying 0 0 0 0  Worry too much - different things 0 0 0 0  Trouble relaxing 0 0 0 0  Restless 0 0 0 0  Easily annoyed or irritable 0 0 0 0  Afraid - awful might happen 0 0 0 0  Total GAD 7 Score 0 0 0 0  Anxiety Difficulty - - - Not difficult at all    BP Readings from Last 3 Encounters:  12/21/20 120/70  11/01/20 120/88  10/01/20 120/80    Physical Exam Vitals and nursing note reviewed.  HENT:     Head: Normocephalic.     Right Ear: Tympanic membrane and external ear normal.     Left Ear: Tympanic membrane and external ear normal.     Nose: Nose normal.  Eyes:     General: No scleral icterus.       Right eye: No discharge.        Left eye: No discharge.     Conjunctiva/sclera: Conjunctivae normal.     Pupils: Pupils are equal, round, and reactive to light.  Neck:     Thyroid: No  thyromegaly.     Vascular: No JVD.     Trachea: No tracheal deviation.  Cardiovascular:     Rate and Rhythm: Normal rate and regular rhythm.     Pulses: Normal pulses.     Heart sounds: Normal heart sounds, S1 normal and S2 normal. No murmur heard. No systolic murmur is present.  No diastolic murmur is present.    No friction rub. No gallop. No S3 or S4 sounds.  Pulmonary:     Effort: No respiratory distress.     Breath sounds: Normal breath sounds. No wheezing or rales.  Abdominal:     General: Bowel sounds are normal.     Palpations: Abdomen is soft. There is no mass.     Tenderness: There is no abdominal tenderness. There is no guarding or rebound.  Musculoskeletal:  General: No tenderness. Normal range of motion.     Cervical back: Normal range of motion and neck supple.  Lymphadenopathy:     Cervical: No cervical adenopathy.  Skin:    General: Skin is warm.     Findings: No rash.  Neurological:     Mental Status: He is alert and oriented to person, place, and time.     Cranial Nerves: No cranial nerve deficit.     Deep Tendon Reflexes: Reflexes are normal and symmetric.    Wt Readings from Last 3 Encounters:  12/21/20 212 lb (96.2 kg)  11/01/20 212 lb (96.2 kg)  10/01/20 213 lb (96.6 kg)    BP 120/70   Pulse 76   Ht 5\' 11"  (1.803 m)   Wt 212 lb (96.2 kg)   BMI 29.57 kg/m   Assessment and Plan:

## 2020-12-22 LAB — CBC WITH DIFFERENTIAL/PLATELET
Basophils Absolute: 0 10*3/uL (ref 0.0–0.2)
Basos: 1 %
EOS (ABSOLUTE): 0.3 10*3/uL (ref 0.0–0.4)
Eos: 6 %
Hematocrit: 39.1 % (ref 37.5–51.0)
Hemoglobin: 13.2 g/dL (ref 13.0–17.7)
Immature Grans (Abs): 0 10*3/uL (ref 0.0–0.1)
Immature Granulocytes: 0 %
Lymphocytes Absolute: 2.1 10*3/uL (ref 0.7–3.1)
Lymphs: 41 %
MCH: 31.5 pg (ref 26.6–33.0)
MCHC: 33.8 g/dL (ref 31.5–35.7)
MCV: 93 fL (ref 79–97)
Monocytes Absolute: 0.8 10*3/uL (ref 0.1–0.9)
Monocytes: 16 %
Neutrophils Absolute: 1.8 10*3/uL (ref 1.4–7.0)
Neutrophils: 36 %
Platelets: 218 10*3/uL (ref 150–450)
RBC: 4.19 x10E6/uL (ref 4.14–5.80)
RDW: 13.1 % (ref 11.6–15.4)
WBC: 4.9 10*3/uL (ref 3.4–10.8)

## 2020-12-22 LAB — RENAL FUNCTION PANEL
Albumin: 4.4 g/dL (ref 3.8–4.9)
BUN/Creatinine Ratio: 22 — ABNORMAL HIGH (ref 9–20)
BUN: 19 mg/dL (ref 6–24)
CO2: 26 mmol/L (ref 20–29)
Calcium: 9.2 mg/dL (ref 8.7–10.2)
Chloride: 98 mmol/L (ref 96–106)
Creatinine, Ser: 0.86 mg/dL (ref 0.76–1.27)
Glucose: 100 mg/dL — ABNORMAL HIGH (ref 65–99)
Phosphorus: 3.2 mg/dL (ref 2.8–4.1)
Potassium: 4.8 mmol/L (ref 3.5–5.2)
Sodium: 137 mmol/L (ref 134–144)
eGFR: 100 mL/min/{1.73_m2} (ref >59)

## 2020-12-22 LAB — LIPID PANEL WITH LDL/HDL RATIO
Cholesterol, Total: 226 mg/dL — ABNORMAL HIGH (ref 100–199)
HDL: 67 mg/dL (ref >39)
LDL Chol Calc (NIH): 131 mg/dL — ABNORMAL HIGH (ref 0–99)
LDL/HDL Ratio: 2 ratio (ref 0.0–3.6)
Triglycerides: 158 mg/dL — ABNORMAL HIGH (ref 0–149)
VLDL Cholesterol Cal: 28 mg/dL (ref 5–40)

## 2021-02-12 ENCOUNTER — Other Ambulatory Visit: Payer: Self-pay | Admitting: Family Medicine

## 2021-02-12 DIAGNOSIS — M1990 Unspecified osteoarthritis, unspecified site: Secondary | ICD-10-CM

## 2021-02-12 NOTE — Telephone Encounter (Signed)
Requested medications are due for refill today yes  Requested medications are on the active medication list yes  Last refill 01/16/21  Last visit 12/21/20  Future visit scheduled 06/23/21  Notes to clinic ordered for back pain, unclear if to be continuous. Please assess.

## 2021-03-16 ENCOUNTER — Other Ambulatory Visit: Payer: Self-pay | Admitting: Family Medicine

## 2021-03-16 DIAGNOSIS — M1990 Unspecified osteoarthritis, unspecified site: Secondary | ICD-10-CM

## 2021-03-25 DIAGNOSIS — H40003 Preglaucoma, unspecified, bilateral: Secondary | ICD-10-CM | POA: Diagnosis not present

## 2021-03-25 DIAGNOSIS — Z0100 Encounter for examination of eyes and vision without abnormal findings: Secondary | ICD-10-CM | POA: Diagnosis not present

## 2021-04-19 ENCOUNTER — Other Ambulatory Visit: Payer: Self-pay

## 2021-04-19 ENCOUNTER — Other Ambulatory Visit: Payer: Self-pay | Admitting: Family Medicine

## 2021-04-19 DIAGNOSIS — M1990 Unspecified osteoarthritis, unspecified site: Secondary | ICD-10-CM

## 2021-04-19 DIAGNOSIS — E7849 Other hyperlipidemia: Secondary | ICD-10-CM

## 2021-04-19 DIAGNOSIS — I10 Essential (primary) hypertension: Secondary | ICD-10-CM

## 2021-04-19 DIAGNOSIS — G40909 Epilepsy, unspecified, not intractable, without status epilepticus: Secondary | ICD-10-CM

## 2021-04-19 MED ORDER — NAPROXEN 250 MG PO TABS: 250.0000 mg | ORAL_TABLET | Freq: Two times a day (BID) | ORAL | 0 refills | Status: DC

## 2021-04-19 NOTE — Progress Notes (Signed)
Sent in naproxen to mail order

## 2021-06-13 ENCOUNTER — Other Ambulatory Visit: Payer: Self-pay | Admitting: Family Medicine

## 2021-06-13 DIAGNOSIS — M1990 Unspecified osteoarthritis, unspecified site: Secondary | ICD-10-CM

## 2021-06-23 ENCOUNTER — Ambulatory Visit (INDEPENDENT_AMBULATORY_CARE_PROVIDER_SITE_OTHER): Payer: Medicare PPO | Admitting: Family Medicine

## 2021-06-23 ENCOUNTER — Encounter: Payer: Self-pay | Admitting: Family Medicine

## 2021-06-23 ENCOUNTER — Other Ambulatory Visit: Payer: Self-pay

## 2021-06-23 VITALS — BP 120/64 | HR 72 | Ht 71.0 in | Wt 210.0 lb

## 2021-06-23 DIAGNOSIS — I10 Essential (primary) hypertension: Secondary | ICD-10-CM

## 2021-06-23 DIAGNOSIS — E7849 Other hyperlipidemia: Secondary | ICD-10-CM

## 2021-06-23 DIAGNOSIS — G40909 Epilepsy, unspecified, not intractable, without status epilepticus: Secondary | ICD-10-CM

## 2021-06-23 MED ORDER — CARBAMAZEPINE 200 MG PO TABS: ORAL_TABLET | ORAL | 1 refills | Status: DC

## 2021-06-23 MED ORDER — LOVASTATIN 40 MG PO TABS: 40.0000 mg | ORAL_TABLET | Freq: Every day | ORAL | 1 refills | Status: DC

## 2021-06-23 MED ORDER — LISINOPRIL-HYDROCHLOROTHIAZIDE 10-12.5 MG PO TABS: 1.0000 | ORAL_TABLET | Freq: Every day | ORAL | 1 refills | Status: DC

## 2021-06-23 MED ORDER — DIVALPROEX SODIUM 500 MG PO DR TAB: 500.0000 mg | DELAYED_RELEASE_TABLET | Freq: Three times a day (TID) | ORAL | 1 refills | Status: DC

## 2021-06-23 NOTE — Progress Notes (Signed)
Date:  06/23/2021   Name:  Jerome Gilmore   DOB:  11/08/1960   MRN:  [id number]   Chief Complaint: Hypertension, Hyperlipidemia, and Seizures  Hypertension This is a chronic problem. The current episode started more than 1 year ago. The problem is unchanged. The problem is controlled. Pertinent negatives include no anxiety, blurred vision, chest pain, headaches, malaise/fatigue, neck pain, orthopnea, palpitations, peripheral edema, PND, shortness of breath or sweats. Past treatments include ACE inhibitors and diuretics. The current treatment provides moderate improvement. There are no compliance problems.  There is no history of angina, kidney disease, CAD/MI, CVA, heart failure, left ventricular hypertrophy, PVD or retinopathy. There is no history of chronic renal disease, a hypertension causing med or renovascular disease.  Hyperlipidemia This is a chronic problem. The current episode started more than 1 year ago. The problem is controlled. Recent lipid tests were reviewed and are normal. He has no history of chronic renal disease, diabetes, hypothyroidism, liver disease, obesity or nephrotic syndrome. Pertinent negatives include no chest pain, myalgias or shortness of breath. Current antihyperlipidemic treatment includes statins.  Seizures  This is a chronic problem. Progression since onset: controlled. Pertinent negatives include no headaches, no sore throat, no chest pain, no cough, no nausea and no diarrhea.   Lab Results  Component Value Date   NA 137 12/21/2020   K 4.8 12/21/2020   CO2 26 12/21/2020   GLUCOSE 100 (H) 12/21/2020   BUN 19 12/21/2020   CREATININE 0.86 12/21/2020   CALCIUM 9.2 12/21/2020   EGFR 100 12/21/2020   GFRNONAA 95 01/08/2020   Lab Results  Component Value Date   CHOL 226 (H) 12/21/2020   HDL 67 12/21/2020   LDLCALC 131 (H) 12/21/2020   TRIG 158 (H) 12/21/2020   CHOLHDL 2.8 02/20/2018   No results found for: TSH No results found for: HGBA1C Lab  Results  Component Value Date   WBC 4.9 12/21/2020   HGB 13.2 12/21/2020   HCT 39.1 12/21/2020   MCV 93 12/21/2020   PLT 218 12/21/2020   Lab Results  Component Value Date   ALT 8 01/08/2020   AST 13 01/08/2020   ALKPHOS 41 (L) 01/08/2020   BILITOT <0.2 01/08/2020   No results found for: 25OHVITD2, 25OHVITD3, VD25OH   Review of Systems  Constitutional:  Negative for chills, fever and malaise/fatigue.  HENT:  Negative for drooling, ear discharge, ear pain and sore throat.   Eyes:  Negative for blurred vision.  Respiratory:  Negative for cough, shortness of breath and wheezing.   Cardiovascular:  Negative for chest pain, palpitations, orthopnea, leg swelling and PND.  Gastrointestinal:  Negative for abdominal pain, blood in stool, constipation, diarrhea and nausea.  Endocrine: Negative for polydipsia.  Genitourinary:  Negative for dysuria, frequency, hematuria and urgency.  Musculoskeletal:  Negative for back pain, myalgias and neck pain.  Skin:  Negative for rash.  Allergic/Immunologic: Negative for environmental allergies.  Neurological:  Positive for seizures. Negative for dizziness and headaches.  Hematological:  Does not bruise/bleed easily.  Psychiatric/Behavioral:  Negative for suicidal ideas. The patient is not nervous/anxious.    Patient Active Problem List   Diagnosis Date Noted   Arthritis 07/11/2019   Taking medication for chronic disease 07/11/2019   Primary osteoarthritis of both knees 05/24/2016   Hyperlipidemia 11/25/2015   Seizure disorder ([place]) 11/25/2015   Primary osteoarthritis of right knee 11/25/2015   Seizure ([place]) 12/03/2014   Essential hypertension 11/26/2014    No Known Allergies  Past  Surgical History:  Procedure Laterality Date   RIB FRACTURE SURGERY      Social History   Tobacco Use   Smoking status: Never   Smokeless tobacco: Never  Vaping Use   Vaping Use: Never used  Substance Use Topics   Alcohol use: No    Alcohol/week: 0.0  standard drinks   Drug use: No     Medication list has been reviewed and updated.  Current Meds  Medication Sig   carbamazepine (TEGRETOL) 200 MG tablet TAKE 2 TABLETS THREE TIMES DAILY   divalproex (DEPAKOTE) 500 MG DR tablet TAKE 1 TABLET THREE TIMES DAILY   lisinopril-hydrochlorothiazide (ZESTORETIC) 10-12.5 MG tablet TAKE 1 TABLET EVERY DAY   lovastatin (MEVACOR) 40 MG tablet TAKE 1 TABLET AT BEDTIME    PHQ 2/9 Scores 06/23/2021 12/21/2020 10/11/2020 10/01/2020  PHQ - 2 Score 0 0 0 0  PHQ- 9 Score 0 0 - 0    GAD 7 : Generalized Anxiety Score 06/23/2021 12/21/2020 10/01/2020 06/22/2020  Nervous, Anxious, on Edge 0 0 0 0  Control/stop worrying 0 0 0 0  Worry too much - different things 0 0 0 0  Trouble relaxing 0 0 0 0  Restless 0 0 0 0  Easily annoyed or irritable 0 0 0 0  Afraid - awful might happen 0 0 0 0  Total GAD 7 Score 0 0 0 0  Anxiety Difficulty Not difficult at all - - -    BP Readings from Last 3 Encounters:  06/23/21 120/64  12/21/20 120/70  11/01/20 120/88    Physical Exam Vitals and nursing note reviewed.  HENT:     Head: Normocephalic.     Right Ear: External ear normal.     Left Ear: External ear normal.     Nose: Nose normal.  Eyes:     General: No scleral icterus.       Right eye: No discharge.        Left eye: No discharge.     Conjunctiva/sclera: Conjunctivae normal.     Pupils: Pupils are equal, round, and reactive to light.  Neck:     Thyroid: No thyromegaly.     Vascular: No JVD.     Trachea: No tracheal deviation.  Cardiovascular:     Rate and Rhythm: Normal rate and regular rhythm.     Heart sounds: Normal heart sounds. No murmur heard.   No friction rub. No gallop.  Pulmonary:     Effort: No respiratory distress.     Breath sounds: Normal breath sounds. No wheezing or rales.  Abdominal:     General: Bowel sounds are normal.     Palpations: Abdomen is soft. There is no mass.     Tenderness: There is no abdominal tenderness. There is  no guarding or rebound.  Musculoskeletal:        General: No tenderness. Normal range of motion.     Cervical back: Normal range of motion and neck supple.  Lymphadenopathy:     Cervical: No cervical adenopathy.  Skin:    General: Skin is warm.     Findings: No rash.  Neurological:     Mental Status: He is alert and oriented to person, place, and time.     Cranial Nerves: No cranial nerve deficit.     Deep Tendon Reflexes: Reflexes are normal and symmetric.    Wt Readings from Last 3 Encounters:  06/23/21 210 lb (95.3 kg)  12/21/20 212 lb (96.2 kg)  11/01/20 212  lb (96.2 kg)    BP 120/64    Pulse 72    Ht _0  (1.803 m)    Wt 210 lb (95.3 kg)    BMI 29.29 kg/m   Assessment and Plan:  1. Essential hypertension Chronic.  Controlled.  Stable.  Blood pressure today is 120/64.  Continue lisinopril hydrochlorothiazide 10-12.5 mg once a day.  Will check CMP for electrolytes and GFR. - lisinopril-hydrochlorothiazide (ZESTORETIC) 10-12.5 MG tablet; Take 1 tablet by mouth daily.  Dispense: 90 tablet; Refill: 1 - Comprehensive Metabolic Panel (CMET)  2. Seizure disorder (HCC) Chronic.  Controlled.  Stable.  Patient's had no seizures since last visit and is controlled on carbamazepine 200 mg 2 tablets 3 times a day and Depakote 500 mg DR 3 times a day. - carbamazepine (TEGRETOL) 200 MG tablet; TAKE 2 TABLETS THREE TIMES DAILY  Dispense: 540 tablet; Refill: 1 - divalproex (DEPAKOTE) 500 MG DR tablet; Take 1 tablet (500 mg total) by mouth 3 (three) times daily.  Dispense: 270 tablet; Refill: 1  3. Other hyperlipidemia Chronic.  Controlled.  Stable.  Controlled on lovastatin 40 mg once a day and we will continue with lipid panel to be done today for surveillance of LDL management. - lovastatin (MEVACOR) 40 MG tablet; Take 1 tablet (40 mg total) by mouth at bedtime.  Dispense: 90 tablet; Refill: 1 - Lipid Panel With LDL/HDL Ratio

## 2021-06-24 LAB — COMPREHENSIVE METABOLIC PANEL
ALT: 9 IU/L (ref 0–44)
AST: 16 IU/L (ref 0–40)
Albumin/Globulin Ratio: 1.8 (ref 1.2–2.2)
Albumin: 4.5 g/dL (ref 3.8–4.9)
Alkaline Phosphatase: 57 IU/L (ref 44–121)
BUN/Creatinine Ratio: 18 (ref 9–20)
BUN: 16 mg/dL (ref 6–24)
Bilirubin Total: 0.2 mg/dL (ref 0.0–1.2)
CO2: 25 mmol/L (ref 20–29)
Calcium: 9.3 mg/dL (ref 8.7–10.2)
Chloride: 95 mmol/L — ABNORMAL LOW (ref 96–106)
Creatinine, Ser: 0.87 mg/dL (ref 0.76–1.27)
Globulin, Total: 2.5 g/dL (ref 1.5–4.5)
Glucose: 87 mg/dL (ref 70–99)
Potassium: 5.1 mmol/L (ref 3.5–5.2)
Sodium: 138 mmol/L (ref 134–144)
Total Protein: 7 g/dL (ref 6.0–8.5)
eGFR: 100 mL/min/{1.73_m2} (ref >59)

## 2021-06-24 LAB — LIPID PANEL WITH LDL/HDL RATIO
Cholesterol, Total: 224 mg/dL — ABNORMAL HIGH (ref 100–199)
HDL: 68 mg/dL (ref >39)
LDL Chol Calc (NIH): 125 mg/dL — ABNORMAL HIGH (ref 0–99)
LDL/HDL Ratio: 1.8 ratio (ref 0.0–3.6)
Triglycerides: 179 mg/dL — ABNORMAL HIGH (ref 0–149)
VLDL Cholesterol Cal: 31 mg/dL (ref 5–40)

## 2021-10-12 ENCOUNTER — Ambulatory Visit (INDEPENDENT_AMBULATORY_CARE_PROVIDER_SITE_OTHER): Payer: Medicare PPO

## 2021-10-12 DIAGNOSIS — Z0000 Encounter for general adult medical examination without abnormal findings: Secondary | ICD-10-CM | POA: Diagnosis not present

## 2021-10-12 NOTE — Progress Notes (Signed)
? ?Subjective:  ? Jerome Gilmore is a 61 y.o. male who presents for Medicare Annual/Subsequent preventive examination. ? ?Virtual Visit via Telephone Note ? ?I connected with  Jerome Gilmore on 10/12/21 at  3:45 PM EDT by telephone and verified that I am speaking with the correct person using two identifiers. ? ?Location: ?Patient: home ?Provider: [facility] ?Persons participating in the virtual visit: patient/Nurse Health Advisor ?  ?I discussed the limitations, risks, security and privacy concerns of performing an evaluation and management service by telephone and the availability of in person appointments. The patient expressed understanding and agreed to proceed. ? ?Interactive audio and video telecommunications were attempted between this nurse and patient, however failed, due to patient having technical difficulties OR patient did not have access to video capability.  We continued and completed visit with audio only. ? ?Some vital signs may be absent or patient reported.  ? ?Clemetine Marker, LPN ? ? ?Review of Systems    ? ?Cardiac Risk Factors include: advanced age (>47men, >33 women);dyslipidemia;hypertension;male gender ? ?   ?Objective:  ?  ?There were no vitals filed for this visit. ?There is no height or weight on file to calculate BMI. ? ? ?  10/12/2021  ?  3:40 PM 10/08/2019  ?  8:57 AM 04/07/2018  ? 11:45 AM  ?Advanced Directives  ?Does Patient Have a Medical Advance Directive? No No No  ?Would patient like information on creating a medical advance directive? No - Patient declined No - Patient declined No - Patient declined  ? ? ?Current Medications (verified) ?Outpatient Encounter Medications as of 10/12/2021  ?Medication Sig  ? carbamazepine (TEGRETOL) 200 MG tablet TAKE 2 TABLETS THREE TIMES DAILY  ? divalproex (DEPAKOTE) 500 MG DR tablet Take 1 tablet (500 mg total) by mouth 3 (three) times daily.  ? lisinopril-hydrochlorothiazide (ZESTORETIC) 10-12.5 MG tablet Take 1 tablet by mouth daily.  ? lovastatin (MEVACOR)  40 MG tablet Take 1 tablet (40 mg total) by mouth at bedtime.  ? meloxicam (MOBIC) 7.5 MG tablet Take by mouth.  ? ?No facility-administered encounter medications on file as of 10/12/2021.  ? ? ?Allergies (verified) ?Patient has no known allergies.  ? ?History: ?Past Medical History:  ?Diagnosis Date  ? Hyperlipidemia   ? Hypertension   ? Joint pain   ? Seizures ([place])   ? ?Past Surgical History:  ?Procedure Laterality Date  ? RIB FRACTURE SURGERY    ? ?History reviewed. No pertinent family history. ?Social History  ? ?Socioeconomic History  ? Marital status: Single  ?  Spouse name: Not on file  ? Number of children: Not on file  ? Years of education: Not on file  ? Highest education level: Not on file  ?Occupational History  ? Not on file  ?Tobacco Use  ? Smoking status: Never  ? Smokeless tobacco: Never  ?Vaping Use  ? Vaping Use: Never used  ?Substance and Sexual Activity  ? Alcohol use: No  ?  Alcohol/week: 0.0 standard drinks  ? Drug use: No  ? Sexual activity: Not Currently  ?Other Topics Concern  ? Not on file  ?Social History Narrative  ? Pt lives alone  ? ?Social Determinants of Health  ? ?Financial Resource Strain: Low Risk   ? Difficulty of Paying Living Expenses: Not hard at all  ?Food Insecurity: No Food Insecurity  ? Worried About Charity fundraiser in the Last Year: Never true  ? Ran Out of Food in the Last Year: Never true  ?Transportation  Needs: No Transportation Needs  ? Lack of Transportation (Medical): No  ? Lack of Transportation (Non-Medical): No  ?Physical Activity: Inactive  ? Days of Exercise per Week: 0 days  ? Minutes of Exercise per Session: 0 min  ?Stress: No Stress Concern Present  ? Feeling of Stress : Only a little  ?Social Connections: Moderately Isolated  ? Frequency of Communication with Friends and Family: More than three times a week  ? Frequency of Social Gatherings with Friends and Family: More than three times a week  ? Attends Religious Services: More than 4 times per year  ?  Active Member of Clubs or Organizations: No  ? Attends Archivist Meetings: Never  ? Marital Status: Never married  ? ? ?Tobacco Counseling ?Counseling given: Not Answered ? ? ?Clinical Intake: ? ?Pre-visit preparation completed: Yes ? ?Pain : No/denies pain ? ?  ? ?Nutritional Risks: None ?Diabetes: No ? ?How often do you need to have someone help you when you read instructions, pamphlets, or other written materials from your doctor or pharmacy?: 1 - Never ? ? ? ?Interpreter Needed?: No ? ?Information entered by :: Clemetine Marker LPN ? ? ?Activities of Daily Living ? ?  10/12/2021  ?  3:41 PM  ?In your present state of health, do you have any difficulty performing the following activities:  ?Hearing? 1  ?Vision? 0  ?Difficulty concentrating or making decisions? 0  ?Walking or climbing stairs? 0  ?Dressing or bathing? 0  ?Doing errands, shopping? 0  ?Preparing Food and eating ? N  ?Using the Toilet? N  ?In the past six months, have you accidently leaked urine? N  ?Do you have problems with loss of bowel control? N  ?Managing your Medications? N  ?Managing your Finances? N  ?Housekeeping or managing your Housekeeping? N  ? ? ?Patient Care Team: ?Juline Patch, MD as PCP - General (Family Medicine) ? ?Indicate any recent Medical Services you may have received from other than [facility] providers in the past year (date may be approximate). ? ?   ?Assessment:  ? This is a routine wellness examination for Jerome Gilmore. ? ?Hearing/Vision screen ?Hearing Screening - Comments:: Pt states mild hearing difficulty in right ear ?Vision Screening - Comments:: [facility] vision screenings [facility] at [facility] ? ?Dietary issues and exercise activities discussed: ?Current Exercise Habits: The patient does not participate in regular exercise at present, Exercise limited by: None identified ? ? Goals Addressed   ? ?  ?  ?  ?  ? This Visit's Progress  ?  DIET - INCREASE WATER INTAKE   On track  ?  Recommend drinking 6-8 glasses of water  per day ? ?  ? ?  ? ?Depression Screen ? ?  10/12/2021  ?  3:40 PM 06/23/2021  ?  8:24 AM 12/21/2020  ?  8:07 AM 10/11/2020  ?  3:42 PM 10/01/2020  ? 10:30 AM 06/22/2020  ?  1:26 PM 01/08/2020  ?  1:49 PM  ?PHQ 2/9 Scores  ?PHQ - 2 Score 0 0 0 0 0 0 0  ?PHQ- 9 Score  0 0  0 0 0  ?  ?Fall Risk ? ?  10/12/2021  ?  3:41 PM 10/11/2020  ?  3:45 PM 01/08/2020  ?  1:49 PM 10/08/2019  ?  9:00 AM 07/11/2019  ? 10:06 AM  ?Fall Risk   ?Falls in the past year? 0 0 0 0 0  ?Number falls in past yr: 0  0  0   ?Injury with Fall? 0 0  0   ?Risk for fall due to : No Fall Risks No Fall Risks No Fall Risks No Fall Risks   ?Follow up Falls prevention discussed Falls prevention discussed Falls evaluation completed Falls prevention discussed Falls evaluation completed  ? ? ?FALL RISK PREVENTION PERTAINING TO THE HOME: ? ?Any stairs in or around the home? No  ?If so, are there any without handrails? No  ?Home free of loose throw rugs in walkways, pet beds, electrical cords, etc? Yes  ?Adequate lighting in your home to reduce risk of falls? Yes  ? ?ASSISTIVE DEVICES UTILIZED TO PREVENT FALLS: ? ?Life alert? No  ?Use of a cane, walker or w/c? No  ?Grab bars in the bathroom? Yes  ?Shower chair or bench in shower? No  ?Elevated toilet seat or a handicapped toilet? No  ? ?TIMED UP AND GO: ? ?Was the test performed? No . Telephonic visit.  ? ?Cognitive Function: Pt declined 6CIT for 2023 AWV.   ? ?  ?  ?  ? ?Immunizations ?Immunization History  ?Administered Date(s) Administered  ? Influenza,inj,quad, With Preservative 04/04/2017, 04/29/2018  ? Influenza-Unspecified 04/13/2019, 04/12/2020, 04/12/2021  ? PFIZER(Purple Top)SARS-COV-2 Vaccination 09/10/2019, 10/01/2019, 04/01/2020  ? Tdap 05/24/2016  ? ? ?TDAP status: Up to date ? ?Flu Vaccine status: Up to date ? ?Pneumococcal vaccine status: Due, Education has been provided regarding the importance of this vaccine. Advised may receive this vaccine at local pharmacy or Health Dept. Aware to provide a copy of the  vaccination record if obtained from local pharmacy or Health Dept. Verbalized acceptance and understanding. ? ?Covid-19 vaccine status: Completed vaccines ? ?Qualifies for Shingles Vaccine? Yes   ?Zost

## 2021-10-12 NOTE — Patient Instructions (Signed)
Jerome Gilmore , ?Thank you for taking time to come for your Medicare Wellness Visit. I appreciate your ongoing commitment to your health goals. Please review the following plan we discussed and let me know if I can assist you in the future.  ? ?Screening recommendations/referrals: ?Colonoscopy: due ?Recommended yearly ophthalmology/optometry visit for glaucoma screening and checkup ?Recommended yearly dental visit for hygiene and checkup ? ?Vaccinations: ?Influenza vaccine: done 04/12/21 ?Pneumococcal vaccine: due ?Tdap vaccine: done 05/24/16 ?Shingles vaccine: Shingrix discussed. Please contact your pharmacy for coverage information.  ?Covid-19: done 09/10/19, 10/01/19 & 04/01/20 ? ?Advanced directives: Advance directive discussed with you today. Even though you declined this today please call our office should you change your mind and we can give you the proper paperwork for you to fill out.  ? ?Conditions/risks identified: recommend increasing activity  ? ?Next appointment: Follow up in one year for your annual wellness visit.  ? ?Preventive Care 2 Years and Older, Male ?Preventive care refers to lifestyle choices and visits with your health care provider that can promote health and wellness. ?What does preventive care include? ?A yearly physical exam. This is also called an annual well check. ?Dental exams once or twice a year. ?Routine eye exams. Ask your health care provider how often you should have your eyes checked. ?Personal lifestyle choices, including: ?Daily care of your teeth and gums. ?Regular physical activity. ?Eating a healthy diet. ?Avoiding tobacco and drug use. ?Limiting alcohol use. ?Practicing safe sex. ?Taking low doses of aspirin every day. ?Taking vitamin and mineral supplements as recommended by your health care provider. ?What happens during an annual well check? ?The services and screenings done by your health care provider during your annual well check will depend on your age, overall  health, lifestyle risk factors, and family history of disease. ?Counseling  ?Your health care provider may ask you questions about your: ?Alcohol use. ?Tobacco use. ?Drug use. ?Emotional well-being. ?Home and relationship well-being. ?Sexual activity. ?Eating habits. ?History of falls. ?Memory and ability to understand (cognition). ?Work and work Astronomer. ?Screening  ?You may have the following tests or measurements: ?Height, weight, and BMI. ?Blood pressure. ?Lipid and cholesterol levels. These may be checked every 5 years, or more frequently if you are over 8 years old. ?Skin check. ?Lung cancer screening. You may have this screening every year starting at age 61 if you have a 30-pack-year history of smoking and currently smoke or have quit within the past 15 years. ?Fecal occult blood test (FOBT) of the stool. You may have this test every year starting at age 50. ?Flexible sigmoidoscopy or colonoscopy. You may have a sigmoidoscopy every 5 years or a colonoscopy every 10 years starting at age 66. ?Prostate cancer screening. Recommendations will vary depending on your family history and other risks. ?Hepatitis C blood test. ?Hepatitis B blood test. ?Sexually transmitted disease (STD) testing. ?Diabetes screening. This is done by checking your blood sugar (glucose) after you have not eaten for a while (fasting). You may have this done every 1-3 years. ?Abdominal aortic aneurysm (AAA) screening. You may need this if you are a current or former smoker. ?Osteoporosis. You may be screened starting at age 29 if you are at high risk. ?Talk with your health care provider about your test results, treatment options, and if necessary, the need for more tests. ?Vaccines  ?Your health care provider may recommend certain vaccines, such as: ?Influenza vaccine. This is recommended every year. ?Tetanus, diphtheria, and acellular pertussis (Tdap, Td) vaccine. You may need  a Td booster every 10 years. ?Zoster vaccine. You may  need this after age 83. ?Pneumococcal 13-valent conjugate (PCV13) vaccine. One dose is recommended after age 83. ?Pneumococcal polysaccharide (PPSV23) vaccine. One dose is recommended after age 53. ?Talk to your health care provider about which screenings and vaccines you need and how often you need them. ?This information is not intended to replace advice given to you by your health care provider. Make sure you discuss any questions you have with your health care provider. ?Document Released: 06/25/2015 Document Revised: 02/16/2016 Document Reviewed: 03/30/2015 ?Elsevier Interactive Patient Education ? 2017 Christmas. ? ?Fall Prevention in the Home ?Falls can cause injuries. They can happen to people of all ages. There are many things you can do to make your home safe and to help prevent falls. ?What can I do on the outside of my home? ?Regularly fix the edges of walkways and driveways and fix any cracks. ?Remove anything that might make you trip as you walk through a door, such as a raised step or threshold. ?Trim any bushes or trees on the path to your home. ?Use bright outdoor lighting. ?Clear any walking paths of anything that might make someone trip, such as rocks or tools. ?Regularly check to see if handrails are loose or broken. Make sure that both sides of any steps have handrails. ?Any raised decks and porches should have guardrails on the edges. ?Have any leaves, snow, or ice cleared regularly. ?Use sand or salt on walking paths during winter. ?Clean up any spills in your garage right away. This includes oil or grease spills. ?What can I do in the bathroom? ?Use night lights. ?Install grab bars by the toilet and in the tub and shower. Do not use towel bars as grab bars. ?Use non-skid mats or decals in the tub or shower. ?If you need to sit down in the shower, use a plastic, non-slip stool. ?Keep the floor dry. Clean up any water that spills on the floor as soon as it happens. ?Remove soap buildup in  the tub or shower regularly. ?Attach bath mats securely with double-sided non-slip rug tape. ?Do not have throw rugs and other things on the floor that can make you trip. ?What can I do in the bedroom? ?Use night lights. ?Make sure that you have a light by your bed that is easy to reach. ?Do not use any sheets or blankets that are too big for your bed. They should not hang down onto the floor. ?Have a firm chair that has side arms. You can use this for support while you get dressed. ?Do not have throw rugs and other things on the floor that can make you trip. ?What can I do in the kitchen? ?Clean up any spills right away. ?Avoid walking on wet floors. ?Keep items that you use a lot in easy-to-reach places. ?If you need to reach something above you, use a strong step stool that has a grab bar. ?Keep electrical cords out of the way. ?Do not use floor polish or wax that makes floors slippery. If you must use wax, use non-skid floor wax. ?Do not have throw rugs and other things on the floor that can make you trip. ?What can I do with my stairs? ?Do not leave any items on the stairs. ?Make sure that there are handrails on both sides of the stairs and use them. Fix handrails that are broken or loose. Make sure that handrails are as long as the stairways. ?Check  any carpeting to make sure that it is firmly attached to the stairs. Fix any carpet that is loose or worn. ?Avoid having throw rugs at the top or bottom of the stairs. If you do have throw rugs, attach them to the floor with carpet tape. ?Make sure that you have a light switch at the top of the stairs and the bottom of the stairs. If you do not have them, ask someone to add them for you. ?What else can I do to help prevent falls? ?Wear shoes that: ?Do not have high heels. ?Have rubber bottoms. ?Are comfortable and fit you well. ?Are closed at the toe. Do not wear sandals. ?If you use a stepladder: ?Make sure that it is fully opened. Do not climb a closed  stepladder. ?Make sure that both sides of the stepladder are locked into place. ?Ask someone to hold it for you, if possible. ?Clearly mark and make sure that you can see: ?Any grab bars or handrails. ?First and last st

## 2021-10-13 ENCOUNTER — Ambulatory Visit
Admission: EM | Admit: 2021-10-13 | Discharge: 2021-10-13 | Disposition: A | Discharge status: Home / Self Care | Payer: Medicare PPO | Attending: Physician Assistant | Admitting: Physician Assistant

## 2021-10-13 ENCOUNTER — Other Ambulatory Visit
Admission: RE | Admit: 2021-10-13 | Discharge: 2021-10-13 | Disposition: A | Discharge status: Home / Self Care | Payer: Medicare PPO | Source: Home / Self Care | Attending: Family Medicine | Admitting: Family Medicine

## 2021-10-13 ENCOUNTER — Ambulatory Visit (INDEPENDENT_AMBULATORY_CARE_PROVIDER_SITE_OTHER): Payer: Medicare PPO | Admitting: Family Medicine

## 2021-10-13 ENCOUNTER — Encounter: Payer: Self-pay | Admitting: Family Medicine

## 2021-10-13 ENCOUNTER — Ambulatory Visit
Admission: RE | Admit: 2021-10-13 | Discharge: 2021-10-13 | Disposition: A | Discharge status: Home / Self Care | Payer: Medicare PPO | Source: Home / Self Care | Attending: Family Medicine | Admitting: Family Medicine

## 2021-10-13 ENCOUNTER — Ambulatory Visit
Admission: RE | Admit: 2021-10-13 | Discharge: 2021-10-13 | Disposition: A | Discharge status: Home / Self Care | Payer: Medicare PPO | Source: Ambulatory Visit | Attending: Family Medicine | Admitting: Family Medicine

## 2021-10-13 ENCOUNTER — Other Ambulatory Visit: Payer: Self-pay

## 2021-10-13 VITALS — BP 113/80 | HR 80 | Temp 97.8°F | Resp 12

## 2021-10-13 DIAGNOSIS — R4182 Altered mental status, unspecified: Secondary | ICD-10-CM | POA: Diagnosis not present

## 2021-10-13 DIAGNOSIS — J301 Allergic rhinitis due to pollen: Secondary | ICD-10-CM

## 2021-10-13 DIAGNOSIS — I10 Essential (primary) hypertension: Secondary | ICD-10-CM | POA: Diagnosis not present

## 2021-10-13 DIAGNOSIS — E785 Hyperlipidemia, unspecified: Secondary | ICD-10-CM | POA: Insufficient documentation

## 2021-10-13 DIAGNOSIS — R051 Acute cough: Secondary | ICD-10-CM

## 2021-10-13 DIAGNOSIS — G40909 Epilepsy, unspecified, not intractable, without status epilepticus: Secondary | ICD-10-CM

## 2021-10-13 DIAGNOSIS — Z87820 Personal history of traumatic brain injury: Secondary | ICD-10-CM | POA: Diagnosis not present

## 2021-10-13 DIAGNOSIS — R0981 Nasal congestion: Secondary | ICD-10-CM | POA: Diagnosis not present

## 2021-10-13 DIAGNOSIS — R059 Cough, unspecified: Secondary | ICD-10-CM | POA: Diagnosis not present

## 2021-10-13 DIAGNOSIS — R509 Fever, unspecified: Secondary | ICD-10-CM | POA: Diagnosis not present

## 2021-10-13 LAB — CBC WITH DIFFERENTIAL/PLATELET
Abs Immature Granulocytes: 0.02 10*3/uL (ref 0.00–0.07)
Basophils Absolute: 0 10*3/uL (ref 0.0–0.1)
Basophils Relative: 0 %
Eosinophils Absolute: 0 10*3/uL (ref 0.0–0.5)
Eosinophils Relative: 0 %
HCT: 41.5 % (ref 39.0–52.0)
Hemoglobin: 15.2 g/dL (ref 13.0–17.0)
Immature Granulocytes: 0 %
Lymphocytes Relative: 20 %
Lymphs Abs: 1.3 10*3/uL (ref 0.7–4.0)
MCH: 31.3 pg (ref 26.0–34.0)
MCHC: 36.6 g/dL — ABNORMAL HIGH (ref 30.0–36.0)
MCV: 85.6 fL (ref 80.0–100.0)
Monocytes Absolute: 0.8 10*3/uL (ref 0.1–1.0)
Monocytes Relative: 12 %
Neutro Abs: 4.7 10*3/uL (ref 1.7–7.7)
Neutrophils Relative %: 68 %
Platelets: 224 10*3/uL (ref 150–400)
RBC: 4.85 MIL/uL (ref 4.22–5.81)
RDW: 12.1 % (ref 11.5–15.5)
WBC: 6.9 10*3/uL (ref 4.0–10.5)
nRBC: 0 % (ref 0.0–0.2)

## 2021-10-13 MED ORDER — GUAIFENESIN-DM 100-10 MG/5ML PO SYRP: 5.0000 mL | ORAL_SOLUTION | ORAL | 0 refills | Status: DC | PRN

## 2021-10-13 MED ORDER — TRIAMCINOLONE ACETONIDE 55 MCG/ACT NA AERO: 2.0000 | INHALATION_SPRAY | Freq: Every day | NASAL | 12 refills | Status: DC
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
## 2021-10-13 NOTE — ED Triage Notes (Signed)
Pt states that he has a cold. ? ?Pt is having sinus pressure and a headache x3days ? ?Pt has taken a home covid test and it was negative.  ?

## 2021-10-13 NOTE — Progress Notes (Signed)
? ? ?Date:  10/13/2021  ? ?Name:  Jerome Gilmore   DOB:  21-May-1961   MRN:  [id number] ? ? ?Chief Complaint: No chief complaint on file. ? ?Fever  ?This is a new problem. The current episode started in the past 7 days. The problem has been waxing and waning. Associated symptoms include congestion, coughing, headaches and a sore throat. Pertinent negatives include no abdominal pain, chest pain, diarrhea, ear pain, muscle aches, nausea, rash, urinary pain or wheezing. Associated symptoms comments: No headache presently. He has tried acetaminophen for the symptoms.  ? ?Lab Results  ?Component Value Date  ? NA 138 06/23/2021  ? K 5.1 06/23/2021  ? CO2 25 06/23/2021  ? GLUCOSE 87 06/23/2021  ? BUN 16 06/23/2021  ? CREATININE 0.87 06/23/2021  ? CALCIUM 9.3 06/23/2021  ? EGFR 100 06/23/2021  ? GFRNONAA 95 01/08/2020  ? ?Lab Results  ?Component Value Date  ? CHOL 224 (H) 06/23/2021  ? HDL 68 06/23/2021  ? LDLCALC 125 (H) 06/23/2021  ? TRIG 179 (H) 06/23/2021  ? CHOLHDL 2.8 02/20/2018  ? ?No results found for: TSH ?No results found for: HGBA1C ?Lab Results  ?Component Value Date  ? WBC 4.9 12/21/2020  ? HGB 13.2 12/21/2020  ? HCT 39.1 12/21/2020  ? MCV 93 12/21/2020  ? PLT 218 12/21/2020  ? ?Lab Results  ?Component Value Date  ? ALT 9 06/23/2021  ? AST 16 06/23/2021  ? ALKPHOS 57 06/23/2021  ? BILITOT 0.2 06/23/2021  ? ?No results found for: 25OHVITD2, [place], VD25OH  ? ?Review of Systems  ?Constitutional:  Positive for fever. Negative for chills.  ?HENT:  Positive for congestion, postnasal drip, rhinorrhea and sore throat. Negative for drooling, ear discharge, ear pain and mouth sores.   ?Respiratory:  Positive for cough. Negative for chest tightness, shortness of breath and wheezing.   ?Cardiovascular:  Negative for chest pain, palpitations and leg swelling.  ?Gastrointestinal:  Negative for abdominal pain, blood in stool, constipation, diarrhea and nausea.  ?Endocrine: Negative for polydipsia.  ?Genitourinary:  Negative for  dysuria, frequency, hematuria and urgency.  ?Musculoskeletal:  Negative for back pain, myalgias and neck pain.  ?Skin:  Negative for rash.  ?Allergic/Immunologic: Negative for environmental allergies.  ?Neurological:  Positive for headaches. Negative for dizziness.  ?Hematological:  Does not bruise/bleed easily.  ?Psychiatric/Behavioral:  Negative for suicidal ideas. The patient is not nervous/anxious.   ? ?Patient Active Problem List  ? Diagnosis Date Noted  ? Arthritis 07/11/2019  ? Taking medication for chronic disease 07/11/2019  ? Primary osteoarthritis of both knees 05/24/2016  ? Hyperlipidemia 11/25/2015  ? Seizure disorder ([place]) 11/25/2015  ? Primary osteoarthritis of right knee 11/25/2015  ? Seizure ([place]) 12/03/2014  ? Essential hypertension 11/26/2014  ? ? ?No Known Allergies ? ?Past Surgical History:  ?Procedure Laterality Date  ? RIB FRACTURE SURGERY    ? ? ?Social History  ? ?Tobacco Use  ? Smoking status: Never  ? Smokeless tobacco: Never  ?Vaping Use  ? Vaping Use: Never used  ?Substance Use Topics  ? Alcohol use: No  ?  Alcohol/week: 0.0 standard drinks  ? Drug use: No  ? ? ? ?Medication list has been reviewed and updated. ? ?No outpatient medications have been marked as taking for the 10/13/21 encounter (Office Visit) with Juline Patch, MD.  ? ? ? ?  06/23/2021  ?  8:24 AM 12/21/2020  ?  8:07 AM 10/01/2020  ? 10:30 AM 06/22/2020  ?  1:26 PM  ?  GAD 7 : Generalized Anxiety Score  ?Nervous, Anxious, on Edge 0 0 0 0  ?Control/stop worrying 0 0 0 0  ?Worry too much - different things 0 0 0 0  ?Trouble relaxing 0 0 0 0  ?Restless 0 0 0 0  ?Easily annoyed or irritable 0 0 0 0  ?Afraid - awful might happen 0 0 0 0  ?Total GAD 7 Score 0 0 0 0  ?Anxiety Difficulty Not difficult at all     ? ? ? ?  10/12/2021  ?  3:40 PM  ?Depression screen PHQ 2/9  ?Decreased Interest 0  ?Down, Depressed, Hopeless 0  ?PHQ - 2 Score 0  ? ? ?BP Readings from Last 3 Encounters:  ?10/13/21 (!) 134/107  ?06/23/21 120/64  ?12/21/20  120/70  ? ? ?Physical Exam ?Vitals and nursing note reviewed.  ?HENT:  ?   Head: Normocephalic.  ?   Right Ear: Tympanic membrane and external ear normal.  ?   Left Ear: Tympanic membrane and external ear normal.  ?   Nose: Congestion present. No rhinorrhea.  ?   Mouth/Throat:  ?   Mouth: Mucous membranes are moist.  ?   Pharynx: No oropharyngeal exudate or posterior oropharyngeal erythema.  ?Eyes:  ?   General: No scleral icterus.    ?   Right eye: No discharge.     ?   Left eye: No discharge.  ?   Conjunctiva/sclera: Conjunctivae normal.  ?   Pupils: Pupils are equal, round, and reactive to light.  ?Neck:  ?   Thyroid: No thyromegaly.  ?   Vascular: No JVD.  ?   Trachea: No tracheal deviation.  ?Cardiovascular:  ?   Rate and Rhythm: Normal rate and regular rhythm.  ?   Heart sounds: Normal heart sounds. No murmur heard. ?  No friction rub. No gallop.  ?Pulmonary:  ?   Effort: No respiratory distress.  ?   Breath sounds: Normal breath sounds. No wheezing, rhonchi or rales.  ?Abdominal:  ?   General: Bowel sounds are normal.  ?   Palpations: Abdomen is soft. There is no mass.  ?   Tenderness: There is no abdominal tenderness. There is no guarding or rebound.  ?Musculoskeletal:     ?   General: No tenderness. Normal range of motion.  ?   Cervical back: Normal range of motion and neck supple.  ?Lymphadenopathy:  ?   Cervical: No cervical adenopathy.  ?Skin: ?   General: Skin is warm.  ?   Findings: No rash.  ?Neurological:  ?   Mental Status: He is alert and oriented to person, place, and time.  ?   Cranial Nerves: No cranial nerve deficit.  ?   Deep Tendon Reflexes: Reflexes are normal and symmetric.  ? ? ?Wt Readings from Last 3 Encounters:  ?06/23/21 210 lb (95.3 kg)  ?12/21/20 212 lb (96.2 kg)  ?11/01/20 212 lb (96.2 kg)  ? ? ?There were no vitals taken for this visit. ? ?Assessment and Plan: ?Patient presents in his usual mental status relating that he had a fever the day prior of 103 with a several day history  of congestion and cough. ?1. Acute cough ?New onset.  Persistent.  Stable.  No shortness of breath.  Will obtain chest x-ray and await reading.  In the meantime pulse ox was noted to be 97% with respirations of 12.  Auscultation of lungs is unremarkable with no rales rhonchi wheezes or rub.  We will treat with guaifenesin with dextromethorphan 1 teaspoon every 4 hours as needed for cough.  X-ray was read as no active cardiopulmonary disease. ?- DG Chest 2 View; Future ?- guaiFENesin-dextromethorphan (ROBITUSSIN DM) 100-10 MG/5ML syrup; Take 5 mLs by mouth every 4 (four) hours as needed for cough.  Dispense: 118 mL; Refill: 0 ? ?2. Seasonal allergic rhinitis due to pollen ?Chronic.  Controlled.  Stable.  Likely seasonal allergies due to pollen at this time.  Patient was placed on Nasacort 2 sprays into the nostril daily. ?- triamcinolone (NASACORT) 55 MCG/ACT AERO nasal inhaler; Place 2 sprays into the nose daily.  Dispense: 1 each; Refill: 12 ? ?3. Seizure disorder ([place]) ?Patient with history of seizure disorder and we will continue Depakote and Tegretol as previously prescribed. ? ? ?
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
## 2021-10-13 NOTE — Discharge Instructions (Addendum)
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
## 2021-10-18 ENCOUNTER — Telehealth: Payer: Self-pay

## 2021-10-18 ENCOUNTER — Ambulatory Visit: Payer: Medicare PPO | Admitting: Family Medicine

## 2021-10-18 DIAGNOSIS — Z9889 Other specified postprocedural states: Secondary | ICD-10-CM | POA: Insufficient documentation

## 2021-10-18 NOTE — Telephone Encounter (Signed)
Called to check on pt's status- no answer. I did speak to pt on Friday and he stated he was "much better now." ?

## 2021-10-18 NOTE — Telephone Encounter (Signed)
Called and pt stated he fells much better. Still a little sore throat, but better ?

## 2021-10-24 DIAGNOSIS — Z8669 Personal history of other diseases of the nervous system and sense organs: Secondary | ICD-10-CM | POA: Insufficient documentation

## 2021-10-24 DIAGNOSIS — Z87898 Personal history of other specified conditions: Secondary | ICD-10-CM | POA: Insufficient documentation

## 2021-10-24 DIAGNOSIS — Z87828 Personal history of other (healed) physical injury and trauma: Secondary | ICD-10-CM | POA: Insufficient documentation

## 2021-10-24 DIAGNOSIS — F068 Other specified mental disorders due to known physiological condition: Secondary | ICD-10-CM | POA: Insufficient documentation

## 2021-12-16 ENCOUNTER — Telehealth: Payer: Self-pay | Admitting: Family Medicine

## 2021-12-16 NOTE — Telephone Encounter (Signed)
Medication Refill - Medication: olopatadine  Has the patient contacted their pharmacy? yes (Agent: If no, request that the patient contact the pharmacy for the refill. If patient does not wish to contact the pharmacy document the reason why and proceed with request.) (Agent: If yes, when and what did the pharmacy advise?)pharmacy called directly in  Preferred Pharmacy (with phone number or street name): [facility] - [place], [place] - [address] [place] [address] Phone: [phone] Fax: [phone] Has the patient been seen for an appointment in the last year OR does the patient have an upcoming appointment? yes  Agent: Please be advised that RX refills may take up to 3 business days. We ask that you follow-up with your pharmacy.

## 2021-12-19 ENCOUNTER — Other Ambulatory Visit: Payer: Self-pay

## 2021-12-19 DIAGNOSIS — J301 Allergic rhinitis due to pollen: Secondary | ICD-10-CM

## 2021-12-19 MED ORDER — TRIAMCINOLONE ACETONIDE 55 MCG/ACT NA AERO: 2.0000 | INHALATION_SPRAY | Freq: Every day | NASAL | 0 refills | Status: AC

## 2021-12-21 ENCOUNTER — Ambulatory Visit: Payer: Medicare PPO | Admitting: Family Medicine

## 2022-01-26 ENCOUNTER — Telehealth: Payer: Self-pay | Admitting: Family Medicine

## 2022-01-26 NOTE — Telephone Encounter (Signed)
Copied from CRM [id number]. Topic: General - Inquiry >> Jan 26, 2022  8:03 AM De Blanch wrote: Reason for CRM: Pt stated has an [place] tax form for disability and needs forms filled out by Dr. Yetta Barre. Pt would like to stop by and drop off forms for the provider to fill out. Pt stated an appointment is not needed; he just needs the forms filled out. Pt stated needs this done as soon as possible and would like to pick up the paperwork right away.   Pt stated just needs to say that pt is disabled.  Please advise. >> Jan 26, 2022  8:08 AM De Blanch wrote: Alternative number to reach pt - [phone]

## 2022-03-14 ENCOUNTER — Other Ambulatory Visit: Payer: Self-pay | Admitting: Family Medicine

## 2022-03-14 DIAGNOSIS — I10 Essential (primary) hypertension: Secondary | ICD-10-CM

## 2022-03-14 DIAGNOSIS — E7849 Other hyperlipidemia: Secondary | ICD-10-CM

## 2022-03-14 DIAGNOSIS — G40909 Epilepsy, unspecified, not intractable, without status epilepticus: Secondary | ICD-10-CM

## 2022-03-20 ENCOUNTER — Other Ambulatory Visit: Payer: Self-pay

## 2022-03-20 ENCOUNTER — Encounter: Payer: Self-pay | Admitting: Family Medicine

## 2022-03-20 ENCOUNTER — Ambulatory Visit (INDEPENDENT_AMBULATORY_CARE_PROVIDER_SITE_OTHER): Payer: Medicare PPO | Admitting: Family Medicine

## 2022-03-20 VITALS — BP 118/78 | HR 64 | Ht 71.0 in | Wt 210.0 lb

## 2022-03-20 DIAGNOSIS — E7849 Other hyperlipidemia: Secondary | ICD-10-CM | POA: Diagnosis not present

## 2022-03-20 DIAGNOSIS — M7591 Shoulder lesion, unspecified, right shoulder: Secondary | ICD-10-CM

## 2022-03-20 DIAGNOSIS — G40909 Epilepsy, unspecified, not intractable, without status epilepticus: Secondary | ICD-10-CM

## 2022-03-20 DIAGNOSIS — I10 Essential (primary) hypertension: Secondary | ICD-10-CM | POA: Diagnosis not present

## 2022-03-20 MED ORDER — CARBAMAZEPINE 200 MG PO TABS: ORAL_TABLET | ORAL | 0 refills | Status: DC

## 2022-03-20 MED ORDER — MELOXICAM 7.5 MG PO TABS: 7.5000 mg | ORAL_TABLET | Freq: Every day | ORAL | 0 refills | Status: DC

## 2022-03-20 MED ORDER — LISINOPRIL-HYDROCHLOROTHIAZIDE 10-12.5 MG PO TABS: 1.0000 | ORAL_TABLET | Freq: Every day | ORAL | 0 refills | Status: DC

## 2022-03-20 MED ORDER — LOVASTATIN 40 MG PO TABS: 40.0000 mg | ORAL_TABLET | Freq: Every day | ORAL | 0 refills | Status: DC

## 2022-03-20 MED ORDER — DIVALPROEX SODIUM 500 MG PO DR TAB: 500.0000 mg | DELAYED_RELEASE_TABLET | Freq: Three times a day (TID) | ORAL | 0 refills | Status: DC
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
## 2022-03-21 LAB — RENAL FUNCTION PANEL
Albumin: 4.5 g/dL (ref 3.8–4.9)
BUN/Creatinine Ratio: 22 — ABNORMAL HIGH (ref 9–20)
BUN: 15 mg/dL (ref 6–24)
CO2: 24 mmol/L (ref 20–29)
Calcium: 8.8 mg/dL (ref 8.7–10.2)
Chloride: 98 mmol/L (ref 96–106)
Creatinine, Ser: 0.68 mg/dL — ABNORMAL LOW (ref 0.76–1.27)
Glucose: 94 mg/dL (ref 70–99)
Phosphorus: 3.3 mg/dL (ref 2.8–4.1)
Potassium: 4.8 mmol/L (ref 3.5–5.2)
Sodium: 139 mmol/L (ref 134–144)
eGFR: 107 mL/min/{1.73_m2} (ref >59)

## 2022-03-21 LAB — LIPID PANEL WITH LDL/HDL RATIO
Cholesterol, Total: 215 mg/dL — ABNORMAL HIGH (ref 100–199)
HDL: 73 mg/dL (ref >39)
LDL Chol Calc (NIH): 119 mg/dL — ABNORMAL HIGH (ref 0–99)
LDL/HDL Ratio: 1.6 ratio (ref 0.0–3.6)
Triglycerides: 133 mg/dL (ref 0–149)
VLDL Cholesterol Cal: 23 mg/dL (ref 5–40)

## 2022-04-04 DIAGNOSIS — H40003 Preglaucoma, unspecified, bilateral: Secondary | ICD-10-CM | POA: Diagnosis not present

## 2022-05-31 ENCOUNTER — Other Ambulatory Visit: Payer: Self-pay | Admitting: Family Medicine

## 2022-05-31 DIAGNOSIS — M7591 Shoulder lesion, unspecified, right shoulder: Secondary | ICD-10-CM

## 2022-08-08 ENCOUNTER — Other Ambulatory Visit: Payer: Self-pay | Admitting: Family Medicine

## 2022-08-08 DIAGNOSIS — E7849 Other hyperlipidemia: Secondary | ICD-10-CM

## 2022-08-08 DIAGNOSIS — M7591 Shoulder lesion, unspecified, right shoulder: Secondary | ICD-10-CM

## 2022-08-08 DIAGNOSIS — I10 Essential (primary) hypertension: Secondary | ICD-10-CM

## 2022-08-08 DIAGNOSIS — G40909 Epilepsy, unspecified, not intractable, without status epilepticus: Secondary | ICD-10-CM

## 2022-08-08 NOTE — Telephone Encounter (Signed)
Requested Prescriptions  Pending Prescriptions Disp Refills   divalproex (DEPAKOTE) 500 MG DR tablet [Pharmacy Med Name: DIVALPROEX SODIUM DR 500 MG Tablet Delayed Release] 270 tablet 0    Sig: TAKE 1 TABLET THREE TIMES DAILY     Neurology:  Anticonvulsants - Valproates Failed - 08/08/2022  1:22 AM      Failed - AST in normal range and within 360 days    AST  Date Value Ref Range Status  06/23/2021 16 0 - 40 IU/L Final         Failed - ALT in normal range and within 360 days    ALT  Date Value Ref Range Status  06/23/2021 9 0 - 44 IU/L Final         Failed - Valproic Acid (serum) in normal range and within 360 days    Valproic Acid Lvl  Date Value Ref Range Status  01/08/2020 86 50 - 100 ug/mL Final    Comment:                                    Detection Limit = 4                            <4 indicates None Detected Toxicity may occur at levels of 100-500. Measurements of free unbound valproic acid may improve the assess- ment of clinical response.          Passed - HGB in normal range and within 360 days    Hemoglobin  Date Value Ref Range Status  10/13/2021 15.2 13.0 - 17.0 g/dL Final  12/21/2020 13.2 13.0 - 17.7 g/dL Final         Passed - PLT in normal range and within 360 days    Platelets  Date Value Ref Range Status  10/13/2021 224 150 - 400 K/uL Final  12/21/2020 218 150 - 450 x10E3/uL Final         Passed - WBC in normal range and within 360 days    WBC  Date Value Ref Range Status  10/13/2021 6.9 4.0 - 10.5 K/uL Final         Passed - HCT in normal range and within 360 days    HCT  Date Value Ref Range Status  10/13/2021 41.5 39.0 - 52.0 % Final   Hematocrit  Date Value Ref Range Status  12/21/2020 39.1 37.5 - 51.0 % Final         Passed - Completed PHQ-2 or PHQ-9 in the last 360 days      Passed - Patient is not pregnant      Passed - Valid encounter within last 12 months    Recent Outpatient Visits           4 months ago Seizure  disorder [facility])   [place] [facility] at [place], Deanna C, MD   9 months ago Acute cough   [place] [facility] at [place], Deanna C, MD   1 year ago Essential hypertension   [place] [facility] at [place], Deanna C, MD   1 year ago Essential hypertension   [place] [facility] at [place], Deanna C, MD   1 year ago Contact dermatitis due to plant   [facility] at [facility], MD       Future Appointments             In 1 month Juline Patch, MD [facility] at [facility], [facility]             carbamazepine (TEGRETOL) 200 MG tablet [Pharmacy Med Name: CARBAMAZEPINE 200 MG Tablet] 540 tablet 0    Sig: TAKE 2 TABLETS THREE TIMES DAILY     Neurology:  Anticonvulsants - carbamazepine Failed - 08/08/2022  1:22 AM      Failed - AST in normal range and within 360 days    AST  Date Value Ref Range Status  06/23/2021 16 0 - 40 IU/L Final         Failed - ALT in normal range and within 360 days    ALT  Date Value Ref Range Status  06/23/2021 9 0 - 44 IU/L Final         Failed - Carbamazepine (serum) in normal range and within 360 days    Carbamazepine (Tegretol), S  Date Value Ref Range Status  01/08/2020 9.8 4.0 - 12.0 ug/mL Final    Comment:             In conjunction with other antiepileptic drugs                                Therapeutic  4.0 -  8.0                                Toxicity     9.0 - 12.0                                    Carbamazepine alone                                Therapeutic  8.0 - 12.0                                 Detection Limit =  2.0                           <2.0 indicated None Detected          Failed - Cr in normal range and within 360 days    Creatinine, Ser  Date Value Ref Range Status  03/20/2022  0.68 (L) 0.76 - 1.27 mg/dL Final         Passed - WBC in normal range and within 360 days    WBC  Date Value Ref Range Status  10/13/2021 6.9 4.0 - 10.5 K/uL Final         Passed - PLT in normal range and within 360 days    Platelets  Date Value Ref Range Status  10/13/2021 224 150 - 400 K/uL Final  12/21/2020 218 150 - 450 x10E3/uL Final         Passed - HGB in normal range and within 360 days    Hemoglobin  Date Value Ref Range Status  10/13/2021 15.2  13.0 - 17.0 g/dL Final  12/21/2020 13.2 13.0 - 17.7 g/dL Final         Passed - Na in normal range and within 360 days    Sodium  Date Value Ref Range Status  03/20/2022 139 134 - 144 mmol/L Final         Passed - HCT in normal range and within 360 days    HCT  Date Value Ref Range Status  10/13/2021 41.5 39.0 - 52.0 % Final   Hematocrit  Date Value Ref Range Status  12/21/2020 39.1 37.5 - 51.0 % Final         Passed - Completed PHQ-2 or PHQ-9 in the last 360 days      Passed - Valid encounter within last 12 months    Recent Outpatient Visits           4 months ago Seizure disorder [facility])   [place] [facility] at [place], Deanna C, MD   9 months ago Acute cough   [place] [facility], MD   1 year ago Essential hypertension   [place] at [place], Deanna C, MD   1 year ago Essential hypertension   [place] at [place], Deanna C, MD   1 year ago Contact dermatitis due to plant   [place] at [place], [place], MD       Future Appointments             In 1 month Juline Patch, MD [facility] at [facility], [facility]            Signed Prescriptions Disp Refills   lovastatin (MEVACOR) 40 MG tablet 90 tablet 0    Sig: TAKE 1 TABLET AT  BEDTIME     Cardiovascular:  Antilipid - Statins 2 Failed - 08/08/2022  1:22 AM      Failed - Cr in normal range and within 360 days    Creatinine, Ser  Date Value Ref Range Status  03/20/2022 0.68 (L) 0.76 - 1.27 mg/dL Final         Failed - Lipid Panel in normal range within the last 12 months    Cholesterol, Total  Date Value Ref Range Status  03/20/2022 215 (H) 100 - 199 mg/dL Final   LDL Chol Calc (NIH)  Date Value Ref Range Status  03/20/2022 119 (H) 0 - 99 mg/dL Final   HDL  Date Value Ref Range Status  03/20/2022 73 >39 mg/dL Final   Triglycerides  Date Value Ref Range Status  03/20/2022 133 0 - 149 mg/dL Final         Passed - Patient is not pregnant      Passed - Valid encounter within last 12 months    Recent Outpatient Visits           4 months ago Seizure disorder [facility])   [place] [facility] at [place], Deanna C, MD   9 months ago Acute cough    Primary Care & Sports Medicine at [place], Deanna C, MD   1 year ago Essential hypertension   [place] at [place], Deanna C, MD   1 year ago  Essential hypertension   [place] [facility] at [place], Deanna C, MD   1 year ago Contact dermatitis due to plant   [place] at [facility], MD       Future Appointments             In 1 month Juline Patch, MD [facility] Primary Care [facility] Medicine at [facility], [facility]             lisinopril-hydrochlorothiazide (ZESTORETIC) 10-12.5 MG tablet 90 tablet 0    Sig: TAKE 1 TABLET EVERY DAY     Cardiovascular:  ACEI + Diuretic Combos Failed - 08/08/2022  1:22 AM      Failed - Cr in normal range and within 180 days    Creatinine, Ser  Date Value Ref Range Status  03/20/2022 0.68 (L) 0.76 - 1.27 mg/dL Final         Passed - Na in normal range and  within 180 days    Sodium  Date Value Ref Range Status  03/20/2022 139 134 - 144 mmol/L Final         Passed - K in normal range and within 180 days    Potassium  Date Value Ref Range Status  03/20/2022 4.8 3.5 - 5.2 mmol/L Final         Passed - eGFR is 30 or above and within 180 days    GFR calc Af Amer  Date Value Ref Range Status  01/08/2020 110 >59 mL/min/1.73 Final    Comment:    **[facility] currently reports eGFR in compliance with the current**   recommendations of the [employer]. [facility] will   update reporting as new guidelines are published from the NKF-ASN   Task force.    GFR calc non Af Amer  Date Value Ref Range Status  01/08/2020 95 >59 mL/min/1.73 Final   eGFR  Date Value Ref Range Status  03/20/2022 107 >59 mL/min/1.73 Final         Passed - Patient is not pregnant      Passed - Last BP in normal range    BP Readings from Last 1 Encounters:  03/20/22 118/78         Passed - Valid encounter within last 6 months    Recent Outpatient Visits           4 months ago Seizure disorder [facility])   [place] [facility] at [place], Deanna C, MD   9 months ago Acute cough   [place] [facility] at [place], Deanna C, MD   1 year ago Essential hypertension   [place], Deanna C, MD   1 year ago Essential hypertension   [place], Deanna C, MD   1 year ago Contact dermatitis due to plant   [place] at [place], Deanna C, MD       Future Appointments             In 1 month Juline Patch, MD [place] at [facility], [facility]             meloxicam (MOBIC) 7.5 MG tablet 90 tablet 0    Sig: TAKE  1 TABLET EVERY DAY FOR SHOULDER PAIN     Analgesics:  COX2 Inhibitors  Failed - 08/08/2022  1:22 AM      Failed - Manual Review: Labs are only required if the patient has taken medication for more than 8 weeks.      Failed - Cr in normal range and within 360 days    Creatinine, Ser  Date Value Ref Range Status  03/20/2022 0.68 (L) 0.76 - 1.27 mg/dL Final         Failed - AST in normal range and within 360 days    AST  Date Value Ref Range Status  06/23/2021 16 0 - 40 IU/L Final         Failed - ALT in normal range and within 360 days    ALT  Date Value Ref Range Status  06/23/2021 9 0 - 44 IU/L Final         Passed - HGB in normal range and within 360 days    Hemoglobin  Date Value Ref Range Status  10/13/2021 15.2 13.0 - 17.0 g/dL Final  12/21/2020 13.2 13.0 - 17.7 g/dL Final         Passed - HCT in normal range and within 360 days    HCT  Date Value Ref Range Status  10/13/2021 41.5 39.0 - 52.0 % Final   Hematocrit  Date Value Ref Range Status  12/21/2020 39.1 37.5 - 51.0 % Final         Passed - eGFR is 30 or above and within 360 days    GFR calc Af Amer  Date Value Ref Range Status  01/08/2020 110 >59 mL/min/1.73 Final    Comment:    **[facility] currently reports eGFR in compliance with the current**   recommendations of the [employer]. [facility] will   update reporting as new guidelines are published from the NKF-ASN   Task force.    GFR calc non Af Amer  Date Value Ref Range Status  01/08/2020 95 >59 mL/min/1.73 Final   eGFR  Date Value Ref Range Status  03/20/2022 107 >59 mL/min/1.73 Final         Passed - Patient is not pregnant      Passed - Valid encounter within last 12 months    Recent Outpatient Visits           4 months ago Seizure disorder [facility])   [place], Deanna C, MD   9 months ago Acute cough   [place], Deanna C, MD   1 year ago Essential hypertension   [place]  Primary [facility] Sports Medicine at [place], Deanna C, MD   1 year ago Essential hypertension   [place] [facility] at [place], Deanna C, MD   1 year ago Contact dermatitis due to plant   [place] at [place], [place], MD       Future Appointments             In 1 month Juline Patch, MD [place] at [facility], [facility]

## 2022-08-08 NOTE — Telephone Encounter (Signed)
Requested Prescriptions  Pending Prescriptions Disp Refills   lovastatin (MEVACOR) 40 MG tablet [Pharmacy Med Name: LOVASTATIN 40 MG Tablet] 90 tablet 0    Sig: TAKE 1 TABLET AT BEDTIME     Cardiovascular:  Antilipid - Statins 2 Failed - 08/08/2022  1:22 AM      Failed - Cr in normal range and within 360 days    Creatinine, Ser  Date Value Ref Range Status  03/20/2022 0.68 (L) 0.76 - 1.27 mg/dL Final         Failed - Lipid Panel in normal range within the last 12 months    Cholesterol, Total  Date Value Ref Range Status  03/20/2022 215 (H) 100 - 199 mg/dL Final   LDL Chol Calc (NIH)  Date Value Ref Range Status  03/20/2022 119 (H) 0 - 99 mg/dL Final   HDL  Date Value Ref Range Status  03/20/2022 73 >39 mg/dL Final   Triglycerides  Date Value Ref Range Status  03/20/2022 133 0 - 149 mg/dL Final         Passed - Patient is not pregnant      Passed - Valid encounter within last 12 months    Recent Outpatient Visits           4 months ago Seizure disorder ([place])   [place] [facility] at [place], Deanna C, MD   9 months ago Acute cough   [place] [facility] at [facility], MD   1 year ago Essential hypertension   [place] [facility] at [place], Deanna C, MD   1 year ago Essential hypertension   [place] at [place], Deanna C, MD   1 year ago Contact dermatitis due to plant   [place] at [place], Deanna C, MD       Future Appointments             In 1 month Juline Patch, MD [facility] Primary [facility] & [facility] Medicine at [place], [facility]             divalproex (DEPAKOTE) 500 MG DR tablet [Pharmacy Med Name: DIVALPROEX SODIUM DR 500 MG Tablet Delayed Release] 270 tablet 3    Sig: TAKE 1 TABLET THREE TIMES DAILY     Neurology:  Anticonvulsants  - Valproates Failed - 08/08/2022  1:22 AM      Failed - AST in normal range and within 360 days    AST  Date Value Ref Range Status  06/23/2021 16 0 - 40 IU/L Final         Failed - ALT in normal range and within 360 days    ALT  Date Value Ref Range Status  06/23/2021 9 0 - 44 IU/L Final         Failed - Valproic Acid (serum) in normal range and within 360 days    Valproic Acid Lvl  Date Value Ref Range Status  01/08/2020 86 50 - 100 ug/mL Final    Comment:                                    Detection Limit = 4                            <  4 indicates None Detected Toxicity may occur at levels of 100-500. Measurements of free unbound valproic acid may improve the assess- ment of clinical response.          Passed - HGB in normal range and within 360 days    Hemoglobin  Date Value Ref Range Status  10/13/2021 15.2 13.0 - 17.0 g/dL Final  12/21/2020 13.2 13.0 - 17.7 g/dL Final         Passed - PLT in normal range and within 360 days    Platelets  Date Value Ref Range Status  10/13/2021 224 150 - 400 K/uL Final  12/21/2020 218 150 - 450 x10E3/uL Final         Passed - WBC in normal range and within 360 days    WBC  Date Value Ref Range Status  10/13/2021 6.9 4.0 - 10.5 K/uL Final         Passed - HCT in normal range and within 360 days    HCT  Date Value Ref Range Status  10/13/2021 41.5 39.0 - 52.0 % Final   Hematocrit  Date Value Ref Range Status  12/21/2020 39.1 37.5 - 51.0 % Final         Passed - Completed PHQ-2 or PHQ-9 in the last 360 days      Passed - Patient is not pregnant      Passed - Valid encounter within last 12 months    Recent Outpatient Visits           4 months ago Seizure disorder [facility])   [place] [facility] at [place], Deanna C, MD   9 months ago Acute cough   [place] [facility] at [place], Deanna C, MD   1 year ago Essential hypertension   [place]  Primary Care & Sports Medicine at [place], Deanna C, MD   1 year ago Essential hypertension   [place] at [place], Deanna C, MD   1 year ago Contact dermatitis due to plant   [place] at [place], [place], MD       Future Appointments             In 1 month Juline Patch, MD [facility] Primary Care & Sports Medicine at [facility], [facility]             lisinopril-hydrochlorothiazide (ZESTORETIC) 10-12.5 MG tablet [Pharmacy Med Name: LISINOPRIL/HYDROCHLOROTHIAZIDE 10-12.5 MG Tablet] 90 tablet 0    Sig: TAKE 1 TABLET EVERY DAY     Cardiovascular:  ACEI + Diuretic Combos Failed - 08/08/2022  1:22 AM      Failed - Cr in normal range and within 180 days    Creatinine, Ser  Date Value Ref Range Status  03/20/2022 0.68 (L) 0.76 - 1.27 mg/dL Final         Passed - Na in normal range and within 180 days    Sodium  Date Value Ref Range Status  03/20/2022 139 134 - 144 mmol/L Final         Passed - K in normal range and within 180 days    Potassium  Date Value Ref Range Status  03/20/2022 4.8 3.5 - 5.2 mmol/L Final         Passed - eGFR is 30 or above and within 180 days    GFR  calc Af Amer  Date Value Ref Range Status  01/08/2020 110 >59 mL/min/1.73 Final    Comment:    **[facility] currently reports eGFR in compliance with the current**   recommendations of the [employer]. [facility] will   update reporting as new guidelines are published from the NKF-ASN   Task force.    GFR calc non Af Amer  Date Value Ref Range Status  01/08/2020 95 >59 mL/min/1.73 Final   eGFR  Date Value Ref Range Status  03/20/2022 107 >59 mL/min/1.73 Final         Passed - Patient is not pregnant      Passed - Last BP in normal range    BP Readings from Last 1 Encounters:  03/20/22 118/78         Passed - Valid encounter within last 6 months    Recent  Outpatient Visits           4 months ago Seizure disorder [facility])   [place] [facility] Medicine at [place], Deanna C, MD   9 months ago Acute cough   [place] [facility] Medicine at [place], Deanna C, MD   1 year ago Essential hypertension   [place] at [place], Deanna C, MD   1 year ago Essential hypertension   [place] at [place], Deanna C, MD   1 year ago Contact dermatitis due to plant   [place] at [place], Deanna C, MD       Future Appointments             In 1 month Juline Patch, MD [place] at [facility], [facility]             meloxicam (MOBIC) 7.5 MG tablet [Pharmacy Med Name: MELOXICAM 7.5 MG Tablet] 90 tablet 0    Sig: TAKE 1 TABLET EVERY DAY FOR SHOULDER PAIN     Analgesics:  COX2 Inhibitors Failed - 08/08/2022  1:22 AM      Failed - Manual Review: Labs are only required if the patient has taken medication for more than 8 weeks.      Failed - Cr in normal range and within 360 days    Creatinine, Ser  Date Value Ref Range Status  03/20/2022 0.68 (L) 0.76 - 1.27 mg/dL Final         Failed - AST in normal range and within 360 days    AST  Date Value Ref Range Status  06/23/2021 16 0 - 40 IU/L Final         Failed - ALT in normal range and within 360 days    ALT  Date Value Ref Range Status  06/23/2021 9 0 - 44 IU/L Final         Passed - HGB in normal range and within 360 days    Hemoglobin  Date Value Ref Range Status  10/13/2021 15.2 13.0 - 17.0 g/dL Final  12/21/2020 13.2 13.0 - 17.7 g/dL Final         Passed - HCT in normal range and within 360 days    HCT  Date Value Ref Range Status  10/13/2021 41.5 39.0 - 52.0 % Final   Hematocrit  Date Value Ref Range Status  12/21/2020 39.1 37.5 - 51.0 % Final  Passed - eGFR is 30 or above and within 360 days    GFR calc Af Amer  Date Value Ref Range Status  01/08/2020 110 >59 mL/min/1.73 Final    Comment:    **[facility] currently reports eGFR in compliance with the current**   recommendations of the [employer]. [facility] will   update reporting as new guidelines are published from the NKF-ASN   Task force.    GFR calc non Af Amer  Date Value Ref Range Status  01/08/2020 95 >59 mL/min/1.73 Final   eGFR  Date Value Ref Range Status  03/20/2022 107 >59 mL/min/1.73 Final         Passed - Patient is not pregnant      Passed - Valid encounter within last 12 months    Recent Outpatient Visits           4 months ago Seizure disorder [facility])   [place] at [place], Deanna C, MD   9 months ago Acute cough   [facility] [place] [facility] [place], Deanna C, MD   1 year ago Essential hypertension   [place] at [place], Deanna C, MD   1 year ago Essential hypertension   [place] at [place], Deanna C, MD   1 year ago Contact dermatitis due to plant   [place] at [facility], MD       Future Appointments             In 1 month Juline Patch, MD [place] at [facility]             carbamazepine (TEGRETOL) 200 MG tablet [Pharmacy Med Name: CARBAMAZEPINE 200 MG Tablet] 540 tablet 3    Sig: TAKE 2 [place]     Neurology:  Anticonvulsants - carbamazepine Failed - 08/08/2022  1:22 AM      Failed - AST in normal range and within 360 days    AST  Date Value Ref Range Status  06/23/2021 16 0 - 40 IU/L Final         Failed - ALT in normal range and within 360 days    ALT  Date Value Ref Range Status  06/23/2021 9 0 - 44 IU/L Final          Failed - Carbamazepine (serum) in normal range and within 360 days    Carbamazepine (Tegretol), S  Date Value Ref Range Status  01/08/2020 9.8 4.0 - 12.0 ug/mL Final    Comment:             In conjunction with other antiepileptic drugs                                Therapeutic  4.0 -  8.0                                Toxicity     9.0 - 12.0                                    Carbamazepine  alone                                Therapeutic  8.0 - 12.0                                 Detection Limit =  2.0                           <2.0 indicated None Detected          Failed - Cr in normal range and within 360 days    Creatinine, Ser  Date Value Ref Range Status  03/20/2022 0.68 (L) 0.76 - 1.27 mg/dL Final         Passed - WBC in normal range and within 360 days    WBC  Date Value Ref Range Status  10/13/2021 6.9 4.0 - 10.5 K/uL Final         Passed - PLT in normal range and within 360 days    Platelets  Date Value Ref Range Status  10/13/2021 224 150 - 400 K/uL Final  12/21/2020 218 150 - 450 x10E3/uL Final         Passed - HGB in normal range and within 360 days    Hemoglobin  Date Value Ref Range Status  10/13/2021 15.2 13.0 - 17.0 g/dL Final  12/21/2020 13.2 13.0 - 17.7 g/dL Final         Passed - Na in normal range and within 360 days    Sodium  Date Value Ref Range Status  03/20/2022 139 134 - 144 mmol/L Final         Passed - HCT in normal range and within 360 days    HCT  Date Value Ref Range Status  10/13/2021 41.5 39.0 - 52.0 % Final   Hematocrit  Date Value Ref Range Status  12/21/2020 39.1 37.5 - 51.0 % Final         Passed - Completed PHQ-2 or PHQ-9 in the last 360 days      Passed - Valid encounter within last 12 months    Recent Outpatient Visits           4 months ago Seizure disorder [facility])   [place] at [place], Deanna C, MD   9 months ago Acute cough   [place] Primary  Care & Sports Medicine at [place], Deanna C, MD   1 year ago Essential hypertension   [place] Primary Care [facility] Sports Medicine at [place], Deanna C, MD   1 year ago Essential hypertension   [place] [facility] Medicine at [place], Deanna C, MD   1 year ago Contact dermatitis due to plant   [place] at [place], [place], MD       Future Appointments             In 1 month Juline Patch, MD [place] at [facility], [facility]

## 2022-09-19 ENCOUNTER — Ambulatory Visit (INDEPENDENT_AMBULATORY_CARE_PROVIDER_SITE_OTHER): Payer: Medicare PPO | Admitting: Family Medicine

## 2022-09-19 ENCOUNTER — Encounter: Payer: Self-pay | Admitting: Family Medicine

## 2022-09-19 VITALS — BP 120/62 | HR 68 | Ht 71.0 in | Wt 208.0 lb

## 2022-09-19 DIAGNOSIS — G40909 Epilepsy, unspecified, not intractable, without status epilepticus: Secondary | ICD-10-CM

## 2022-09-19 DIAGNOSIS — I10 Essential (primary) hypertension: Secondary | ICD-10-CM

## 2022-09-19 DIAGNOSIS — E7849 Other hyperlipidemia: Secondary | ICD-10-CM | POA: Diagnosis not present

## 2022-09-19 DIAGNOSIS — R69 Illness, unspecified: Secondary | ICD-10-CM | POA: Diagnosis not present

## 2022-09-19 DIAGNOSIS — M7591 Shoulder lesion, unspecified, right shoulder: Secondary | ICD-10-CM

## 2022-09-19 MED ORDER — LISINOPRIL-HYDROCHLOROTHIAZIDE 10-12.5 MG PO TABS: 1.0000 | ORAL_TABLET | Freq: Every day | ORAL | 1 refills | Status: DC

## 2022-09-19 MED ORDER — DIVALPROEX SODIUM 500 MG PO DR TAB: 500.0000 mg | DELAYED_RELEASE_TABLET | Freq: Three times a day (TID) | ORAL | 1 refills | Status: DC

## 2022-09-19 MED ORDER — LOVASTATIN 40 MG PO TABS: 40.0000 mg | ORAL_TABLET | Freq: Every day | ORAL | 1 refills | Status: DC

## 2022-09-19 MED ORDER — MELOXICAM 7.5 MG PO TABS: ORAL_TABLET | ORAL | 1 refills | Status: DC

## 2022-09-19 MED ORDER — CARBAMAZEPINE 200 MG PO TABS: ORAL_TABLET | ORAL | 1 refills | Status: DC

## 2022-09-19 NOTE — Progress Notes (Signed)
Date:  09/19/2022   Name:  Jerome Gilmore   DOB:  1961/01/02   MRN:  [id number]   Chief Complaint: Seizures, Hypertension, Hyperlipidemia, and Arthritis  Seizures  This is a chronic problem. Episode onset: none over the past several years. The problem has been gradually improving. Pertinent negatives include no headaches, no sore throat, no chest pain, no cough, no nausea and no diarrhea.  Hypertension This is a chronic problem. The current episode started more than 1 year ago. The problem has been gradually improving since onset. The problem is controlled. Pertinent negatives include no chest pain, headaches, neck pain, orthopnea, palpitations, PND or shortness of breath. There are no associated agents to hypertension. Past treatments include ACE inhibitors and diuretics. The current treatment provides moderate improvement. There are no compliance problems.  There is no history of angina, kidney disease, CAD/MI, CVA, heart failure, left ventricular hypertrophy, PVD or retinopathy. There is no history of chronic renal disease, a hypertension causing med or renovascular disease.  Hyperlipidemia This is a chronic problem. The current episode started more than 1 year ago. The problem is controlled. Recent lipid tests were reviewed and are normal. He has no history of chronic renal disease or diabetes. Pertinent negatives include no chest pain, myalgias or shortness of breath. Current antihyperlipidemic treatment includes statins. The current treatment provides moderate improvement of lipids. Risk factors for coronary artery disease include dyslipidemia and hypertension.  Arthritis Presents for follow-up visit. He reports no pain, stiffness, joint swelling or joint warmth. The symptoms have been stable. Pertinent negatives include no diarrhea, dysuria, fever or rash.    Lab Results  Component Value Date   NA 139 03/20/2022   K 4.8 03/20/2022   CO2 24 03/20/2022   GLUCOSE 94 03/20/2022   BUN 15  03/20/2022   CREATININE 0.68 (L) 03/20/2022   CALCIUM 8.8 03/20/2022   EGFR 107 03/20/2022   GFRNONAA 95 01/08/2020   Lab Results  Component Value Date   CHOL 215 (H) 03/20/2022   HDL 73 03/20/2022   LDLCALC 119 (H) 03/20/2022   TRIG 133 03/20/2022   CHOLHDL 2.8 02/20/2018   No results found for: "TSH" No results found for: "HGBA1C" Lab Results  Component Value Date   WBC 6.9 10/13/2021   HGB 15.2 10/13/2021   HCT 41.5 10/13/2021   MCV 85.6 10/13/2021   PLT 224 10/13/2021   Lab Results  Component Value Date   ALT 9 06/23/2021   AST 16 06/23/2021   ALKPHOS 57 06/23/2021   BILITOT 0.2 06/23/2021   No results found for: "25OHVITD2", "25OHVITD3", "VD25OH"   Review of Systems  Constitutional:  Negative for chills and fever.  HENT:  Negative for drooling, ear discharge, ear pain and sore throat.   Respiratory:  Negative for cough, shortness of breath and wheezing.   Cardiovascular:  Negative for chest pain, palpitations, orthopnea, leg swelling and PND.  Gastrointestinal:  Negative for abdominal pain, blood in stool, constipation, diarrhea and nausea.  Endocrine: Negative for polydipsia.  Genitourinary:  Negative for dysuria, frequency, hematuria and urgency.  Musculoskeletal:  Positive for arthritis. Negative for back pain, joint swelling, myalgias, neck pain and stiffness.  Skin:  Negative for rash.  Allergic/Immunologic: Negative for environmental allergies.  Neurological:  Positive for seizures. Negative for dizziness and headaches.  Hematological:  Does not bruise/bleed easily.  Psychiatric/Behavioral:  Negative for suicidal ideas. The patient is not nervous/anxious.     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  10/24/2021   History of cranial surgery 10/18/2021   Arthritis 07/11/2019   Taking medication for chronic disease 07/11/2019   Primary osteoarthritis of both knees 05/24/2016   Hyperlipidemia 11/25/2015   pt has slurred speech and nervousness/ shaking  11/25/2015   Primary osteoarthritis of right knee 11/25/2015   Seizure 12/03/2014   Essential hypertension 11/26/2014    No Known Allergies  Past Surgical History:  Procedure Laterality Date   RIB FRACTURE SURGERY      Social History   Tobacco Use   Smoking status: Never   Smokeless tobacco: Never  Vaping Use   Vaping Use: Never used  Substance Use Topics   Alcohol use: No    Alcohol/week: 0.0 standard drinks of alcohol   Drug use: No     Medication list has been reviewed and updated.  Current Meds  Medication Sig   carbamazepine (TEGRETOL) 200 MG tablet TAKE 2 TABLETS THREE TIMES DAILY   divalproex (DEPAKOTE) 500 MG DR tablet TAKE 1 TABLET THREE TIMES DAILY   lisinopril-hydrochlorothiazide (ZESTORETIC) 10-12.5 MG tablet TAKE 1 TABLET EVERY DAY   lovastatin (MEVACOR) 40 MG tablet TAKE 1 TABLET AT BEDTIME   meloxicam (MOBIC) 7.5 MG tablet TAKE 1 TABLET EVERY DAY FOR SHOULDER PAIN   triamcinolone (NASACORT) 55 MCG/ACT AERO nasal inhaler Place 2 sprays into the nose daily.       09/19/2022   10:36 AM 03/20/2022   10:31 AM 06/23/2021    8:24 AM 12/21/2020    8:07 AM  GAD 7 : Generalized Anxiety Score  Nervous, Anxious, on Edge 0 0 0 0  Control/stop worrying 0 0 0 0  Worry too much - different things 0 0 0 0  Trouble relaxing 0 0 0 0  Restless 0 0 0 0  Easily annoyed or irritable 0 0 0 0  Afraid - awful might happen 0 0 0 0  Total GAD 7 Score 0 0 0 0  Anxiety Difficulty Not difficult at all Not difficult at all Not difficult at all        09/19/2022   10:36 AM 03/20/2022   10:31 AM 10/12/2021    3:40 PM  Depression screen PHQ 2/9  Decreased Interest 0 0 0  Down, Depressed, Hopeless 0 0 0  PHQ - 2 Score 0 0 0   Altered sleeping 0 0   Tired, decreased energy 0 0   Change in appetite 0 0   Feeling bad or failure about yourself  0 0   Trouble concentrating 0 0   Moving slowly or fidgety/restless 0 0   Suicidal thoughts 0 0   PHQ-9 Score 0 0   Difficult doing work/chores Not difficult at all Not difficult at all     BP Readings from Last 3 Encounters:  09/19/22 120/62  03/20/22 118/78  10/13/21 (!) 134/107    Physical Exam Vitals and nursing note reviewed.  HENT:     Head: Normocephalic.     Right Ear: Tympanic membrane, ear canal and external ear normal.     Left Ear: Tympanic membrane, ear canal and external ear normal.     Nose: Nose normal. No congestion or rhinorrhea.     Mouth/Throat:     Mouth: Mucous membranes are moist.  Eyes:     General: No scleral icterus.       Right eye: No discharge.        Left eye: No discharge.     Conjunctiva/sclera: Conjunctivae normal.     Pupils: Pupils are equal, round, and reactive to light.  Neck:     Thyroid: No thyromegaly.     Vascular: No JVD.     Trachea: No tracheal deviation.  Cardiovascular:     Rate and Rhythm: Normal rate and regular rhythm.     Chest Wall: PMI is not displaced.     Heart sounds: Normal heart sounds, S1 normal and S2 normal. No murmur heard.    No systolic murmur is present.     No diastolic murmur is present.     No friction rub. No gallop. No S3 or S4 sounds.  Pulmonary:     Effort: No respiratory distress.     Breath sounds: Normal breath sounds. No wheezing or rales.  Abdominal:     General: Bowel sounds are normal.     Palpations: Abdomen is soft. There is no mass.     Tenderness: There is no abdominal tenderness. There is no guarding or rebound.  Musculoskeletal:        General: No tenderness. Normal range of motion.     Cervical back: Normal range of motion and neck supple.  Lymphadenopathy:     Cervical: No cervical adenopathy.  Skin:    General: Skin is warm.     Findings: No rash.   Neurological:     Mental Status: He is alert and oriented to person, place, and time.     Cranial Nerves: No cranial nerve deficit.     Deep Tendon Reflexes: Reflexes are normal and symmetric.     Wt Readings from Last 3 Encounters:  09/19/22 208 lb (94.3 kg)  03/20/22 210 lb (95.3 kg)  06/23/21 210 lb (95.3 kg)    BP 120/62   Pulse 68   Ht 5\' 11"  (1.803 m)   Wt 208 lb (94.3 kg)   SpO2 98%   BMI 29.01 kg/m   Assessment and Plan:  1. Essential hypertension Chronic.  Controlled.  Stable.  Blood pressure 120/62.  Asymptomatic.  Tolerating medication well.  Continue lisinopril hydrochlorothiazide 10-12.5 mg once a day.  Will check CMP for electrolytes and GFR.  Will recheck patient in 6 months - lisinopril-hydrochlorothiazide (ZESTORETIC) 10-12.5 MG tablet; Take 1 tablet by mouth daily.  Dispense: 90 tablet; Refill: 1 - Comprehensive Metabolic Panel (CMET)  2. Seizure disorder Chronic.  Controlled.  Stable.  Patient has not had any seizures for several years and is tolerating medications well we will continue Tegretol 200 mg 2 tablets 3 times a day and Depakote 500 mg 3 times a day. - carbamazepine (TEGRETOL) 200 MG tablet; TAKE 2 TABLETS THREE TIMES DAILY  Dispense: 540 tablet; Refill: 1 - divalproex (DEPAKOTE) 500 MG DR tablet; Take 1 tablet (500 mg total) by mouth 3 (three) times daily.  Dispense: 270 tablet; Refill: 1  3. Other hyperlipidemia .  Controlled.  Stable.  Continue lovastatin 40 mg once a day. - lovastatin (MEVACOR) 40 MG tablet; Take 1 tablet (40 mg total) by mouth at bedtime.  Dispense: 90 tablet; Refill: 1  4. Supraspinatus tendonitis, right Chronic.  Controlled.  Stable.  Continue meloxicam 7.5 mg once a day. - meloxicam (MOBIC) 7.5 MG tablet; TAKE 1 TABLET EVERY DAY FOR SHOULDER PAIN  Dispense: 90 tablet; Refill: 1  5. Taking medication for chronic disease Chronic.  Chronic.  Controlled.  Stable.  Tolerating medications and there is been no adverse  reactions to any other systemic systems. - CBC w/Diff/Platelet    Elizabeth Sauer, MD

## 2022-09-20 LAB — COMPREHENSIVE METABOLIC PANEL
ALT: 10 IU/L (ref 0–44)
AST: 12 IU/L (ref 0–40)
Albumin/Globulin Ratio: 2.2 (ref 1.2–2.2)
Albumin: 4.3 g/dL (ref 3.8–4.9)
Alkaline Phosphatase: 47 IU/L (ref 44–121)
BUN/Creatinine Ratio: 19 (ref 9–20)
BUN: 15 mg/dL (ref 6–24)
Bilirubin Total: <0.2 mg/dL (ref 0.0–1.2)
CO2: 23 mmol/L (ref 20–29)
Calcium: 9.1 mg/dL (ref 8.7–10.2)
Chloride: 96 mmol/L (ref 96–106)
Creatinine, Ser: 0.81 mg/dL (ref 0.76–1.27)
Globulin, Total: 2 g/dL (ref 1.5–4.5)
Glucose: 97 mg/dL (ref 70–99)
Potassium: 4.7 mmol/L (ref 3.5–5.2)
Sodium: 134 mmol/L (ref 134–144)
Total Protein: 6.3 g/dL (ref 6.0–8.5)
eGFR: 102 mL/min/{1.73_m2} (ref >59)

## 2022-09-20 LAB — CBC WITH DIFFERENTIAL/PLATELET
Basophils Absolute: 0.1 10*3/uL (ref 0.0–0.2)
Basos: 1 %
EOS (ABSOLUTE): 0.1 10*3/uL (ref 0.0–0.4)
Eos: 4 %
Hematocrit: 37.6 % (ref 37.5–51.0)
Hemoglobin: 12.8 g/dL — ABNORMAL LOW (ref 13.0–17.7)
Immature Grans (Abs): 0 10*3/uL (ref 0.0–0.1)
Immature Granulocytes: 0 %
Lymphocytes Absolute: 1.3 10*3/uL (ref 0.7–3.1)
Lymphs: 36 %
MCH: 30.8 pg (ref 26.6–33.0)
MCHC: 34 g/dL (ref 31.5–35.7)
MCV: 91 fL (ref 79–97)
Monocytes Absolute: 0.6 10*3/uL (ref 0.1–0.9)
Monocytes: 16 %
Neutrophils Absolute: 1.5 10*3/uL (ref 1.4–7.0)
Neutrophils: 43 %
Platelets: 219 10*3/uL (ref 150–450)
RBC: 4.15 x10E6/uL (ref 4.14–5.80)
RDW: 12.9 % (ref 11.6–15.4)
WBC: 3.6 10*3/uL (ref 3.4–10.8)

## 2022-10-17 ENCOUNTER — Other Ambulatory Visit: Payer: Self-pay

## 2022-10-17 DIAGNOSIS — D649 Anemia, unspecified: Secondary | ICD-10-CM | POA: Diagnosis not present

## 2022-10-17 NOTE — Progress Notes (Signed)
Printed labs

## 2022-10-18 LAB — CBC WITH DIFFERENTIAL/PLATELET
Basophils Absolute: 0.1 10*3/uL (ref 0.0–0.2)
Basos: 1 %
EOS (ABSOLUTE): 0.1 10*3/uL (ref 0.0–0.4)
Eos: 2 %
Hematocrit: 37.1 % — ABNORMAL LOW (ref 37.5–51.0)
Hemoglobin: 12.9 g/dL — ABNORMAL LOW (ref 13.0–17.7)
Immature Grans (Abs): 0 10*3/uL (ref 0.0–0.1)
Immature Granulocytes: 0 %
Lymphocytes Absolute: 1.4 10*3/uL (ref 0.7–3.1)
Lymphs: 26 %
MCH: 31.4 pg (ref 26.6–33.0)
MCHC: 34.8 g/dL (ref 31.5–35.7)
MCV: 90 fL (ref 79–97)
Monocytes Absolute: 0.6 10*3/uL (ref 0.1–0.9)
Monocytes: 12 %
Neutrophils Absolute: 3.2 10*3/uL (ref 1.4–7.0)
Neutrophils: 59 %
Platelets: 219 10*3/uL (ref 150–450)
RBC: 4.11 x10E6/uL — ABNORMAL LOW (ref 4.14–5.80)
RDW: 12.3 % (ref 11.6–15.4)
WBC: 5.4 10*3/uL (ref 3.4–10.8)

## 2022-10-21 ENCOUNTER — Other Ambulatory Visit: Payer: Self-pay | Admitting: Family Medicine

## 2022-10-21 DIAGNOSIS — M7591 Shoulder lesion, unspecified, right shoulder: Secondary | ICD-10-CM

## 2022-10-23 NOTE — Telephone Encounter (Signed)
Requested Prescriptions  Pending Prescriptions Disp Refills   meloxicam (MOBIC) 7.5 MG tablet [Pharmacy Med Name: MELOXICAM 7.5 MG Tablet] 90 tablet 3    Sig: TAKE 1 TABLET EVERY DAY FOR SHOULDER PAIN     Analgesics:  COX2 Inhibitors Failed - 10/21/2022 11:17 AM      Failed - Manual Review: Labs are only required if the patient has taken medication for more than 8 weeks.      Failed - HGB in normal range and within 360 days    Hemoglobin  Date Value Ref Range Status  10/17/2022 12.9 (L) 13.0 - 17.7 g/dL Final         Failed - HCT in normal range and within 360 days    Hematocrit  Date Value Ref Range Status  10/17/2022 37.1 (L) 37.5 - 51.0 % Final         Passed - Cr in normal range and within 360 days    Creatinine, Ser  Date Value Ref Range Status  09/19/2022 0.81 0.76 - 1.27 mg/dL Final         Passed - AST in normal range and within 360 days    AST  Date Value Ref Range Status  09/19/2022 12 0 - 40 IU/L Final         Passed - ALT in normal range and within 360 days    ALT  Date Value Ref Range Status  09/19/2022 10 0 - 44 IU/L Final         Passed - eGFR is 30 or above and within 360 days    GFR calc Af Amer  Date Value Ref Range Status  01/08/2020 110 >59 mL/min/1.73 Final    Comment:    **[facility] currently reports eGFR in compliance with the current**   recommendations of the [employer]. [facility] will   update reporting as new guidelines are published from the NKF-ASN   Task force.    GFR calc non Af Amer  Date Value Ref Range Status  01/08/2020 95 >59 mL/min/1.73 Final   eGFR  Date Value Ref Range Status  09/19/2022 102 >59 mL/min/1.73 Final         Passed - Patient is not pregnant      Passed - Valid encounter within last 12 months    Recent Outpatient Visits           1 month ago Essential hypertension   [facility]   7 months ago Seizure disorder [facility])    [facility]   1 year ago Acute cough   [facility]   1 year ago Essential hypertension   [facility]   1 year ago Essential hypertension    [place] [facility]       Future Appointments             In 5 months Duanne Limerick, MD [facility], [facility]

## 2022-11-07 ENCOUNTER — Other Ambulatory Visit: Payer: Self-pay | Admitting: Family Medicine

## 2022-11-07 DIAGNOSIS — E7849 Other hyperlipidemia: Secondary | ICD-10-CM

## 2022-11-07 DIAGNOSIS — I10 Essential (primary) hypertension: Secondary | ICD-10-CM

## 2022-11-08 ENCOUNTER — Other Ambulatory Visit: Payer: Self-pay | Admitting: Family Medicine

## 2022-11-08 ENCOUNTER — Ambulatory Visit (INDEPENDENT_AMBULATORY_CARE_PROVIDER_SITE_OTHER): Payer: Medicare PPO

## 2022-11-08 VITALS — Ht 71.0 in | Wt 208.0 lb

## 2022-11-08 DIAGNOSIS — Z0000 Encounter for general adult medical examination without abnormal findings: Secondary | ICD-10-CM | POA: Diagnosis not present

## 2022-11-08 DIAGNOSIS — G40909 Epilepsy, unspecified, not intractable, without status epilepticus: Secondary | ICD-10-CM

## 2022-11-08 NOTE — Patient Instructions (Signed)
Mr. Jerome Gilmore , Thank you for taking time to come for your Medicare Wellness Visit. I appreciate your ongoing commitment to your health goals. Please review the following plan we discussed and let me know if I can assist you in the future.   These are the goals we discussed:  Goals      DIET - EAT MORE FRUITS AND VEGETABLES     DIET - INCREASE WATER INTAKE     Recommend drinking 6-8 glasses of water per day         This is a list of the screening recommended for you and due dates:  Health Maintenance  Topic Date Due   HIV Screening  Never done   Zoster (Shingles) Vaccine (1 of 2) Never done   COVID-19 Vaccine (4 - 2023-24 season) 02/10/2022   Flu Shot  01/11/2023   Medicare Annual Wellness Visit  11/08/2023   Colon Cancer Screening  11/24/2025   DTaP/Tdap/Td vaccine (2 - Td or Tdap) 05/24/2026   Hepatitis C Screening  Completed   HPV Vaccine  Aged Out    Advanced directives: no  Conditions/risks identified: none  Next appointment: Follow up in one year for your annual wellness visit 11/14/23 @ 2:00 pm by phone  Preventive Care 40-64 Years, Male Preventive care refers to lifestyle choices and visits with your health care provider that can promote health and wellness. What does preventive care include? A yearly physical exam. This is also called an annual well check. Dental exams once or twice a year. Routine eye exams. Ask your health care provider how often you should have your eyes checked. Personal lifestyle choices, including: Daily care of your teeth and gums. Regular physical activity. Eating a healthy diet. Avoiding tobacco and drug use. Limiting alcohol use. Practicing safe sex. Taking low-dose aspirin every day starting at age 78. What happens during an annual well check? The services and screenings done by your health care provider during your annual well check will depend on your age, overall health, lifestyle risk factors, and family history of  disease. Counseling  Your health care provider may ask you questions about your: Alcohol use. Tobacco use. Drug use. Emotional well-being. Home and relationship well-being. Sexual activity. Eating habits. Work and work Astronomer. Screening  You may have the following tests or measurements: Height, weight, and BMI. Blood pressure. Lipid and cholesterol levels. These may be checked every 5 years, or more frequently if you are over 70 years old. Skin check. Lung cancer screening. You may have this screening every year starting at age 57 if you have a 30-pack-year history of smoking and currently smoke or have quit within the past 15 years. Fecal occult blood test (FOBT) of the stool. You may have this test every year starting at age 62. Flexible sigmoidoscopy or colonoscopy. You may have a sigmoidoscopy every 5 years or a colonoscopy every 10 years starting at age 72. Prostate cancer screening. Recommendations will vary depending on your family history and other risks. Hepatitis C blood test. Hepatitis B blood test. Sexually transmitted disease (STD) testing. Diabetes screening. This is done by checking your blood sugar (glucose) after you have not eaten for a while (fasting). You may have this done every 1-3 years. Discuss your test results, treatment options, and if necessary, the need for more tests with your health care provider. Vaccines  Your health care provider may recommend certain vaccines, such as: Influenza vaccine. This is recommended every year. Tetanus, diphtheria, and acellular pertussis (Tdap, Td)  vaccine. You may need a Td booster every 10 years. Zoster vaccine. You may need this after age 43. Pneumococcal 13-valent conjugate (PCV13) vaccine. You may need this if you have certain conditions and have not been vaccinated. Pneumococcal polysaccharide (PPSV23) vaccine. You may need one or two doses if you smoke cigarettes or if you have certain conditions. Talk to your  health care provider about which screenings and vaccines you need and how often you need them. This information is not intended to replace advice given to you by your health care provider. Make sure you discuss any questions you have with your health care provider. Document Released: 06/25/2015 Document Revised: 02/16/2016 Document Reviewed: 03/30/2015 Elsevier Interactive Patient Education  2017 [employer].  Fall Prevention in the Home Falls can cause injuries. They can happen to people of all ages. There are many things you can do to make your home safe and to help prevent falls. What can I do on the outside of my home? Regularly fix the edges of walkways and driveways and fix any cracks. Remove anything that might make you trip as you walk through a door, such as a raised step or threshold. Trim any bushes or trees on the path to your home. Use bright outdoor lighting. Clear any walking paths of anything that might make someone trip, such as rocks or tools. Regularly check to see if handrails are loose or broken. Make sure that both sides of any steps have handrails. Any raised decks and porches should have guardrails on the edges. Have any leaves, snow, or ice cleared regularly. Use sand or salt on walking paths during winter. Clean up any spills in your garage right away. This includes oil or grease spills. What can I do in the bathroom? Use night lights. Install grab bars by the toilet and in the tub and shower. Do not use towel bars as grab bars. Use non-skid mats or decals in the tub or shower. If you need to sit down in the shower, use a plastic, non-slip stool. Keep the floor dry. Clean up any water that spills on the floor as soon as it happens. Remove soap buildup in the tub or shower regularly. Attach bath mats securely with double-sided non-slip rug tape. Do not have throw rugs and other things on the floor that can make you trip. What can I do in the bedroom? Use night  lights. Make sure that you have a light by your bed that is easy to reach. Do not use any sheets or blankets that are too big for your bed. They should not hang down onto the floor. Have a firm chair that has side arms. You can use this for support while you get dressed. Do not have throw rugs and other things on the floor that can make you trip. What can I do in the kitchen? Clean up any spills right away. Avoid walking on wet floors. Keep items that you use a lot in easy-to-reach places. If you need to reach something above you, use a strong step stool that has a grab bar. Keep electrical cords out of the way. Do not use floor polish or wax that makes floors slippery. If you must use wax, use non-skid floor wax. Do not have throw rugs and other things on the floor that can make you trip. What can I do with my stairs? Do not leave any items on the stairs. Make sure that there are handrails on both sides of the stairs  and use them. Fix handrails that are broken or loose. Make sure that handrails are as long as the stairways. Check any carpeting to make sure that it is firmly attached to the stairs. Fix any carpet that is loose or worn. Avoid having throw rugs at the top or bottom of the stairs. If you do have throw rugs, attach them to the floor with carpet tape. Make sure that you have a light switch at the top of the stairs and the bottom of the stairs. If you do not have them, ask someone to add them for you. What else can I do to help prevent falls? Wear shoes that: Do not have high heels. Have rubber bottoms. Are comfortable and fit you well. Are closed at the toe. Do not wear sandals. If you use a stepladder: Make sure that it is fully opened. Do not climb a closed stepladder. Make sure that both sides of the stepladder are locked into place. Ask someone to hold it for you, if possible. Clearly mark and make sure that you can see: Any grab bars or handrails. First and last  steps. Where the edge of each step is. Use tools that help you move around (mobility aids) if they are needed. These include: Canes. Walkers. Scooters. Crutches. Turn on the lights when you go into a dark area. Replace any light bulbs as soon as they burn out. Set up your furniture so you have a clear path. Avoid moving your furniture around. If any of your floors are uneven, fix them. If there are any pets around you, be aware of where they are. Review your medicines with your doctor. Some medicines can make you feel dizzy. This can increase your chance of falling. Ask your doctor what other things that you can do to help prevent falls. This information is not intended to replace advice given to you by your health care provider. Make sure you discuss any questions you have with your health care provider. Document Released: 03/25/2009 Document Revised: 11/04/2015 Document Reviewed: 07/03/2014 Elsevier Interactive Patient Education  2017 [employer].

## 2022-11-08 NOTE — Progress Notes (Signed)
I connected with  Jerome Gilmore on 11/08/22 by a audio enabled telemedicine application and verified that I am speaking with the correct person using two identifiers.  Patient Location: Home  Provider Location: Office/Clinic  I discussed the limitations of evaluation and management by telemedicine. The patient expressed understanding and agreed to proceed.  Subjective:   Jerome Gilmore is a 62 y.o. male who presents for Medicare Annual/Subsequent preventive examination.  Review of Systems     Cardiac Risk Factors include: advanced age (>27men, >45 women);dyslipidemia;hypertension;male gender     Objective:    There were no vitals filed for this visit. There is no height or weight on file to calculate BMI.     11/08/2022    2:35 PM 10/12/2021    3:40 PM 10/08/2019    8:57 AM 04/07/2018   11:45 AM  Advanced Directives  Does Patient Have a Medical Advance Directive? No No No No  Would patient like information on creating a medical advance directive? No - Patient declined No - Patient declined No - Patient declined No - Patient declined    Current Medications (verified) Outpatient Encounter Medications as of 11/08/2022  Medication Sig   carbamazepine (TEGRETOL) 200 MG tablet TAKE 2 TABLETS THREE TIMES DAILY   divalproex (DEPAKOTE) 500 MG DR tablet Take 1 tablet (500 mg total) by mouth 3 (three) times daily.   lisinopril-hydrochlorothiazide (ZESTORETIC) 10-12.5 MG tablet TAKE 1 TABLET EVERY DAY   lovastatin (MEVACOR) 40 MG tablet TAKE 1 TABLET AT BEDTIME   meloxicam (MOBIC) 7.5 MG tablet TAKE 1 TABLET EVERY DAY FOR SHOULDER PAIN   triamcinolone (NASACORT) 55 MCG/ACT AERO nasal inhaler Place 2 sprays into the nose daily.   No facility-administered encounter medications on file as of 11/08/2022.    Allergies (verified) Patient has no known allergies.   History: Past Medical History:  Diagnosis Date   Hyperlipidemia    Hypertension    Joint pain    Seizure disorder (HCC)  11/25/2015   Seizures (HCC)    Past Surgical History:  Procedure Laterality Date   RIB FRACTURE SURGERY     History reviewed. No pertinent family history. Social History   Socioeconomic History   Marital status: Single    Spouse name: Not on file   Number of children: Not on file   Years of education: Not on file   Highest education level: Not on file  Occupational History   Not on file  Tobacco Use   Smoking status: Never   Smokeless tobacco: Never  Vaping Use   Vaping Use: Never used  Substance and Sexual Activity   Alcohol use: No    Alcohol/week: 0.0 standard drinks of alcohol   Drug use: No   Sexual activity: Not Currently  Other Topics Concern   Not on file  Social History Narrative   Pt lives alone   Social Determinants of Gilmore   Financial Resource Strain: Low Risk  (11/08/2022)   Overall Financial Resource Strain (CARDIA)    Difficulty of Paying Living Expenses: Not very hard  Food Insecurity: No Food Insecurity (11/08/2022)   Hunger Vital Sign    Worried About Running Out of Food in the Last Year: Never true    Ran Out of Food in the Last Year: Never true  Transportation Needs: No Transportation Needs (11/08/2022)   PRAPARE - Administrator, Civil Service (Medical): No    Lack of Transportation (Non-Medical): No  Physical Activity: Inactive (11/08/2022)   Exercise Vital Sign  Days of Exercise per Week: 0 days    Minutes of Exercise per Session: 0 min  Stress: No Stress Concern Present (11/08/2022)   Jerome Gilmore - Occupational Stress Questionnaire    Feeling of Stress : Only a little  Social Connections: Moderately Isolated (11/08/2022)   Social Connection and Isolation Panel [NHANES]    Frequency of Communication with Friends and Family: Twice a week    Frequency of Social Gatherings with Friends and Family: Once a week    Attends Religious Services: More than 4 times per year    Active Member of [employer] or  Organizations: No    Attends Engineer, structural: Never    Marital Status: Never married    Tobacco Counseling Counseling given: Not Answered   Clinical Intake:  Pre-visit preparation completed: Yes  Pain : No/denies pain     Nutritional Risks: None Diabetes: No  How often do you need to have someone help you when you read instructions, pamphlets, or other written materials from your doctor or pharmacy?: 1 - Never  Diabetic?no  Interpreter Needed?: No  Information entered by :: Kennedy Bucker, LPN   Activities of Daily Living    11/08/2022    2:37 PM  In your present state of health, do you have any difficulty performing the following activities:  Hearing? 0  Vision? 0  Difficulty concentrating or making decisions? 0  Walking or climbing stairs? 0  Dressing or bathing? 0  Doing errands, shopping? 0  Preparing Food and eating ? N  Using the Toilet? N  In the past six months, have you accidently leaked urine? N  Do you have problems with loss of bowel control? N  Managing your Medications? N  Managing your Finances? N  Housekeeping or managing your Housekeeping? N    Patient Care Team: Duanne Limerick, MD as PCP - General (Family Medicine)  Indicate any recent Medical Services you may have received from other than [facility] providers in the past year (date may be approximate).     Assessment:   This is a routine wellness examination for [place].  Hearing/Vision screen Hearing Screening - Comments:: No aids Vision Screening - Comments:: Wears glasses- [facility]  Dietary issues and exercise activities discussed: Current Exercise Habits: The patient does not participate in regular exercise at present   Goals Addressed             This Visit's Progress    DIET - EAT MORE FRUITS AND VEGETABLES         Depression Screen    11/08/2022    2:33 PM 09/19/2022   10:36 AM 03/20/2022   10:31 AM 10/12/2021    3:40 PM 06/23/2021    8:24 AM 12/21/2020     8:07 AM 10/11/2020    3:42 PM  PHQ 2/9 Scores  PHQ - 2 Score 1 0 0 0 0 0 0  PHQ- 9 Score 1 0 0  0 0     Fall Risk    11/08/2022    2:37 PM 09/19/2022   10:36 AM 03/20/2022   10:30 AM 10/12/2021    3:41 PM 10/11/2020    3:45 PM  Fall Risk   Falls in the past year? 0 0 0 0 0  Number falls in past yr: 0 0 0 0 0  Injury with Fall? 0 0 0 0 0  Risk for fall due to : No Fall Risks No Fall Risks No Fall Risks No  Fall Risks No Fall Risks  Follow up Falls prevention discussed;Falls evaluation completed Falls evaluation completed;Falls prevention discussed Falls evaluation completed Falls prevention discussed Falls prevention discussed    FALL RISK PREVENTION PERTAINING TO THE HOME:  Any stairs in or around the home? Yes  If so, are there any without handrails? No  Home free of loose throw rugs in walkways, pet beds, electrical cords, etc? Yes  Adequate lighting in your home to reduce risk of falls? Yes   ASSISTIVE DEVICES UTILIZED TO PREVENT FALLS:  Life alert? No  Use of a cane, walker or w/c? No  Grab bars in the bathroom? No  Shower chair or bench in shower? No  Elevated toilet seat or a handicapped toilet? No    Cognitive Function:pt unable to participate 2024        Immunizations Immunization History  Administered Date(s) Administered   Influenza,inj,quad, With Preservative 04/04/2017, 04/29/2018   Influenza-Unspecified 04/13/2019, 04/12/2020, 04/12/2021   PFIZER(Purple Top)SARS-COV-2 Vaccination 09/10/2019, 10/01/2019, 04/01/2020   Tdap 05/24/2016    TDAP status: Up to date  Flu Vaccine status: Declined, Education has been provided regarding the importance of this vaccine but patient still declined. Advised may receive this vaccine at local pharmacy or [facility] Dept. Aware to provide a copy of the vaccination record if obtained from local pharmacy or [facility] Dept. Verbalized acceptance and understanding.  Pneumococcal vaccine status: Declined,  Education has been provided  regarding the importance of this vaccine but patient still declined. Advised may receive this vaccine at local pharmacy or [facility] Dept. Aware to provide a copy of the vaccination record if obtained from local pharmacy or [facility] Dept. Verbalized acceptance and understanding.   Covid-19 vaccine status: Completed vaccines  Qualifies for Shingles Vaccine? Yes   Zostavax completed No   Shingrix Completed?: No.    Education has been provided regarding the importance of this vaccine. Patient has been advised to call insurance company to determine out of pocket expense if they have not yet received this vaccine. Advised may also receive vaccine at local pharmacy or [facility] Dept. Verbalized acceptance and understanding.  Screening Tests Gilmore Maintenance  Topic Date Due   HIV Screening  Never done   Zoster Vaccines- Shingrix (1 of 2) Never done   COVID-19 Vaccine (4 - 2023-24 season) 02/10/2022   INFLUENZA VACCINE  01/11/2023   Medicare Annual Wellness (AWV)  11/08/2023   Colonoscopy  11/24/2025   DTaP/Tdap/Td (2 - Td or Tdap) 05/24/2026   Hepatitis C Screening  Completed   HPV VACCINES  Aged Out    Gilmore Maintenance  Gilmore Maintenance Due  Topic Date Due   HIV Screening  Never done   Zoster Vaccines- Shingrix (1 of 2) Never done   COVID-19 Vaccine (4 - 2023-24 season) 02/10/2022    Colorectal cancer screening: Type of screening: Colonoscopy. Completed 11/25/15. Repeat every 10 years  Lung Cancer Screening: (Low Dose CT Chest recommended if Age 26-80 years, 30 pack-year currently smoking OR have quit w/in 15years.) does not qualify.    Additional Screening:  Hepatitis C Screening: does qualify; Completed 05/24/16  Vision Screening: Recommended annual ophthalmology exams for early detection of glaucoma and other disorders of the eye. Is the patient up to date with their annual eye exam?  Yes  Who is the provider or what is the name of the office in which the patient attends annual  eye exams? [facility] If pt is not established with a provider, would they like to be referred to  a provider to establish care? No .   Dental Screening: Recommended annual dental exams for proper oral hygiene  Community Resource Referral / Chronic Care Management: CRR required this visit?  No   CCM required this visit?  No      Plan:     I have personally reviewed and noted the following in the patient's chart:   Medical and social history Use of alcohol, tobacco or illicit drugs  Current medications and supplements including opioid prescriptions. Patient is not currently taking opioid prescriptions. Functional ability and status Nutritional status Physical activity Advanced directives List of other physicians Hospitalizations, surgeries, and ER visits in previous 12 months Vitals Screenings to include cognitive, depression, and falls Referrals and appointments  In addition, I have reviewed and discussed with patient certain preventive protocols, quality metrics, and best practice recommendations. A written personalized care plan for preventive services as well as general preventive Gilmore recommendations were provided to patient.     Hal Hope, LPN   [id number]   Nurse Notes: none

## 2022-11-09 NOTE — Telephone Encounter (Signed)
Requested Prescriptions  Pending Prescriptions Disp Refills   divalproex (DEPAKOTE) 500 MG DR tablet [Pharmacy Med Name: DIVALPROEX SODIUM DR 500 MG Tablet Delayed Release] 270 tablet 3    Sig: TAKE 1 TABLET THREE TIMES DAILY     Neurology:  Anticonvulsants - Valproates Failed - 11/08/2022  1:13 PM      Failed - HGB in normal range and within 360 days    Hemoglobin  Date Value Ref Range Status  10/17/2022 12.9 (L) 13.0 - 17.7 g/dL Final         Failed - HCT in normal range and within 360 days    Hematocrit  Date Value Ref Range Status  10/17/2022 37.1 (L) 37.5 - 51.0 % Final         Failed - Valproic Acid (serum) in normal range and within 360 days    Valproic Acid Lvl  Date Value Ref Range Status  01/08/2020 86 50 - 100 ug/mL Final    Comment:                                    Detection Limit = 4                            <4 indicates None Detected Toxicity may occur at levels of 100-500. Measurements of free unbound valproic acid may improve the assess- ment of clinical response.          Passed - AST in normal range and within 360 days    AST  Date Value Ref Range Status  09/19/2022 12 0 - 40 IU/L Final         Passed - ALT in normal range and within 360 days    ALT  Date Value Ref Range Status  09/19/2022 10 0 - 44 IU/L Final         Passed - PLT in normal range and within 360 days    Platelets  Date Value Ref Range Status  10/17/2022 219 150 - 450 x10E3/uL Final         Passed - WBC in normal range and within 360 days    WBC  Date Value Ref Range Status  10/17/2022 5.4 3.4 - 10.8 x10E3/uL Final  10/13/2021 6.9 4.0 - 10.5 K/uL Final         Passed - Completed PHQ-2 or PHQ-9 in the last 360 days      Passed - Patient is not pregnant      Passed - Valid encounter within last 12 months    Recent Outpatient Visits           1 month ago Essential hypertension   [place] [facility]   7  months ago Seizure disorder [facility])   [place] [facility]   1 year ago Acute cough   [place] [facility]   1 year ago Essential hypertension   [place] [facility]   1 year ago Essential hypertension    [facility]       Future Appointments  In 4 months Duanne Limerick, MD [facility] at [facility]             carbamazepine (TEGRETOL) 200 MG tablet [Pharmacy Med Name: CARBAMAZEPINE 200 MG Tablet] 540 tablet 3    Sig: TAKE 2 TABLETS THREE TIMES DAILY     Neurology:  Anticonvulsants - carbamazepine Failed - 11/08/2022  1:13 PM      Failed - Carbamazepine (serum) in normal range and within 360 days    Carbamazepine (Tegretol), S  Date Value Ref Range Status  01/08/2020 9.8 4.0 - 12.0 ug/mL Final    Comment:             In conjunction with other antiepileptic drugs                                Therapeutic  4.0 -  8.0                                Toxicity     9.0 - 12.0                                    Carbamazepine alone                                Therapeutic  8.0 - 12.0                                 Detection Limit =  2.0                           <2.0 indicated None Detected          Failed - HGB in normal range and within 360 days    Hemoglobin  Date Value Ref Range Status  10/17/2022 12.9 (L) 13.0 - 17.7 g/dL Final         Failed - HCT in normal range and within 360 days    Hematocrit  Date Value Ref Range Status  10/17/2022 37.1 (L) 37.5 - 51.0 % Final         Passed - AST in normal range and within 360 days    AST  Date Value Ref Range Status  09/19/2022 12 0 - 40 IU/L Final         Passed - ALT in normal range and within 360 days    ALT   Date Value Ref Range Status  09/19/2022 10 0 - 44 IU/L Final         Passed - WBC in normal range and within 360 days    WBC  Date Value Ref Range Status  10/17/2022 5.4 3.4 - 10.8 x10E3/uL Final  10/13/2021 6.9 4.0 - 10.5 K/uL Final         Passed - PLT in normal range and within 360 days    Platelets  Date Value Ref Range Status  10/17/2022 219 150 - 450 x10E3/uL Final         Passed - Na in normal range and within 360 days    Sodium  Date Value Ref Range Status  09/19/2022 134 134 - 144 mmol/L Final         Passed - Cr in normal range and within 360 days    Creatinine, Ser  Date Value Ref Range Status  09/19/2022 0.81 0.76 - 1.27 mg/dL Final         Passed - Completed PHQ-2 or PHQ-9 in the last 360 days      Passed - Valid encounter within last 12 months    Recent Outpatient Visits           1 month ago Essential hypertension   [place] [facility]   7 months ago Seizure disorder [facility])   [place] [facility]   1 year ago Acute cough   [place] [facility]   1 year ago Essential hypertension   [place] [facility]   1 year ago Essential hypertension   [place] [facility]       Future Appointments             In 4 months Duanne Limerick, MD [facility] at [facility], [facility]

## 2022-12-15 ENCOUNTER — Other Ambulatory Visit: Payer: Self-pay | Admitting: Family Medicine

## 2022-12-15 DIAGNOSIS — G40909 Epilepsy, unspecified, not intractable, without status epilepticus: Secondary | ICD-10-CM

## 2022-12-15 NOTE — Telephone Encounter (Signed)
Requested medication (s) are due for refill today: routing for review  Requested medication (s) are on the active medication list: yes  Last refill:  09/19/22  Future visit scheduled: yes  Notes to clinic:  Last refill 09/19/22 for 90 and 1 refill. The order class states, no print. Routing for review.     Requested Prescriptions  Pending Prescriptions Disp Refills   divalproex (DEPAKOTE) 500 MG DR tablet [Pharmacy Med Name: DIVALPROEX SODIUM DR 500 MG Tablet Delayed Release] 270 tablet 3    Sig: TAKE 1 TABLET THREE TIMES DAILY     Neurology:  Anticonvulsants - Valproates Failed - 12/15/2022 11:23 AM      Failed - HGB in normal range and within 360 days    Hemoglobin  Date Value Ref Range Status  10/17/2022 12.9 (L) 13.0 - 17.7 g/dL Final         Failed - HCT in normal range and within 360 days    Hematocrit  Date Value Ref Range Status  10/17/2022 37.1 (L) 37.5 - 51.0 % Final         Failed - Valproic Acid (serum) in normal range and within 360 days    Valproic Acid Lvl  Date Value Ref Range Status  01/08/2020 86 50 - 100 ug/mL Final    Comment:                                    Detection Limit = 4                            <4 indicates None Detected Toxicity may occur at levels of 100-500. Measurements of free unbound valproic acid may improve the assess- ment of clinical response.          Passed - AST in normal range and within 360 days    AST  Date Value Ref Range Status  09/19/2022 12 0 - 40 IU/L Final         Passed - ALT in normal range and within 360 days    ALT  Date Value Ref Range Status  09/19/2022 10 0 - 44 IU/L Final         Passed - PLT in normal range and within 360 days    Platelets  Date Value Ref Range Status  10/17/2022 219 150 - 450 x10E3/uL Final         Passed - WBC in normal range and within 360 days    WBC  Date Value Ref Range Status  10/17/2022 5.4 3.4 - 10.8 x10E3/uL Final  10/13/2021 6.9 4.0 - 10.5 K/uL Final         Passed  - Completed PHQ-2 or PHQ-9 in the last 360 days      Passed - Patient is not pregnant      Passed - Valid encounter within last 12 months    Recent Outpatient Visits           2 months ago Essential hypertension   [place] [facility]   9 months ago Seizure disorder [facility])   [place] [facility]   1 year ago Acute cough   [place] [facility]  1 year ago Essential hypertension   [place] [facility]   1 year ago Essential hypertension   [place] [facility]       Future Appointments             In 3 months Duanne Limerick, MD [facility] at [facility]             carbamazepine (TEGRETOL) 200 MG tablet [Pharmacy Med Name: CARBAMAZEPINE 200 MG Tablet] 540 tablet 3    Sig: TAKE 2 TABLETS THREE TIMES DAILY     Neurology:  Anticonvulsants - carbamazepine Failed - 12/15/2022 11:23 AM      Failed - Carbamazepine (serum) in normal range and within 360 days    Carbamazepine (Tegretol), S  Date Value Ref Range Status  01/08/2020 9.8 4.0 - 12.0 ug/mL Final    Comment:             In conjunction with other antiepileptic drugs                                Therapeutic  4.0 -  8.0                                Toxicity     9.0 - 12.0                                    Carbamazepine alone                                Therapeutic  8.0 - 12.0                                 Detection Limit =  2.0                           <2.0 indicated None Detected          Failed - HGB in normal range and within 360 days    Hemoglobin  Date Value Ref Range Status  10/17/2022 12.9 (L) 13.0 - 17.7 g/dL Final         Failed - HCT in normal range  and within 360 days    Hematocrit  Date Value Ref Range Status  10/17/2022 37.1 (L) 37.5 - 51.0 % Final         Passed - AST in normal range and within 360 days    AST  Date Value Ref Range Status  09/19/2022 12 0 - 40 IU/L Final         Passed - ALT in normal range and within 360 days    ALT  Date Value Ref Range Status  09/19/2022 10 0 - 44 IU/L Final         Passed - WBC in normal range and within 360 days    WBC  Date Value Ref Range Status  10/17/2022 5.4 3.4 - 10.8 x10E3/uL Final  10/13/2021 6.9 4.0 - 10.5 K/uL Final  Passed - PLT in normal range and within 360 days    Platelets  Date Value Ref Range Status  10/17/2022 219 150 - 450 x10E3/uL Final         Passed - Na in normal range and within 360 days    Sodium  Date Value Ref Range Status  09/19/2022 134 134 - 144 mmol/L Final         Passed - Cr in normal range and within 360 days    Creatinine, Ser  Date Value Ref Range Status  09/19/2022 0.81 0.76 - 1.27 mg/dL Final         Passed - Completed PHQ-2 or PHQ-9 in the last 360 days      Passed - Valid encounter within last 12 months    Recent Outpatient Visits           2 months ago Essential hypertension   [place] [facility]   9 months ago Seizure disorder [facility])   [place] [facility]   1 year ago Acute cough   [place] [facility]   1 year ago Essential hypertension   [place] [facility]   1 year ago Essential hypertension   [place] [facility]       Future Appointments             In 3 months Duanne Limerick, MD [facility] at [facility], [facility]

## 2022-12-21 ENCOUNTER — Other Ambulatory Visit: Payer: Self-pay | Admitting: Family Medicine

## 2022-12-21 DIAGNOSIS — G40909 Epilepsy, unspecified, not intractable, without status epilepticus: Secondary | ICD-10-CM

## 2023-03-12 ENCOUNTER — Other Ambulatory Visit: Payer: Self-pay | Admitting: Family Medicine

## 2023-03-12 DIAGNOSIS — G40909 Epilepsy, unspecified, not intractable, without status epilepticus: Secondary | ICD-10-CM

## 2023-03-13 NOTE — Telephone Encounter (Signed)
Callpatient to come in for appt in next 30 days

## 2023-03-22 ENCOUNTER — Ambulatory Visit (INDEPENDENT_AMBULATORY_CARE_PROVIDER_SITE_OTHER): Payer: Medicare PPO | Admitting: Family Medicine

## 2023-03-22 ENCOUNTER — Encounter: Payer: Self-pay | Admitting: Family Medicine

## 2023-03-22 VITALS — BP 122/82 | HR 63 | Ht 71.0 in | Wt 210.0 lb

## 2023-03-22 DIAGNOSIS — D649 Anemia, unspecified: Secondary | ICD-10-CM

## 2023-03-22 DIAGNOSIS — I10 Essential (primary) hypertension: Secondary | ICD-10-CM | POA: Diagnosis not present

## 2023-03-22 DIAGNOSIS — E7849 Other hyperlipidemia: Secondary | ICD-10-CM

## 2023-03-22 DIAGNOSIS — G40909 Epilepsy, unspecified, not intractable, without status epilepticus: Secondary | ICD-10-CM

## 2023-03-22 DIAGNOSIS — R69 Illness, unspecified: Secondary | ICD-10-CM | POA: Diagnosis not present

## 2023-03-22 MED ORDER — CARBAMAZEPINE 200 MG PO TABS
ORAL_TABLET | ORAL | 0 refills | Status: DC
Start: 2023-03-22 — End: 2023-03-22

## 2023-03-22 MED ORDER — LOVASTATIN 40 MG PO TABS
40.0000 mg | ORAL_TABLET | Freq: Every day | ORAL | 1 refills | Status: DC
Start: 2023-03-22 — End: 2023-08-23

## 2023-03-22 MED ORDER — LISINOPRIL-HYDROCHLOROTHIAZIDE 10-12.5 MG PO TABS
1.0000 | ORAL_TABLET | Freq: Every day | ORAL | 1 refills | Status: DC
Start: 2023-03-22 — End: 2023-08-23

## 2023-03-22 MED ORDER — DIVALPROEX SODIUM 500 MG PO DR TAB
500.0000 mg | DELAYED_RELEASE_TABLET | Freq: Three times a day (TID) | ORAL | 0 refills | Status: DC
Start: 2023-03-22 — End: 2023-03-22

## 2023-03-22 MED ORDER — DIVALPROEX SODIUM 500 MG PO DR TAB
500.0000 mg | DELAYED_RELEASE_TABLET | Freq: Three times a day (TID) | ORAL | 1 refills | Status: DC
Start: 2023-03-22 — End: 2023-08-23

## 2023-03-22 MED ORDER — CARBAMAZEPINE 200 MG PO TABS
ORAL_TABLET | ORAL | 1 refills | Status: DC
Start: 2023-03-22 — End: 2023-08-23

## 2023-03-22 NOTE — Progress Notes (Signed)
Date:  03/22/2023   Name:  Jerome Gilmore   DOB:  Jul 01, 1960   MRN:  [id number]   Chief Complaint: Hypertension, Hyperlipidemia, Allergies, and Seizures  Hypertension This is a chronic problem. The current episode started more than 1 year ago. The problem has been waxing and waning since onset. The problem is controlled. Pertinent negatives include no anxiety, blurred vision, chest pain, headaches, malaise/fatigue, neck pain, orthopnea, palpitations, peripheral edema, PND, shortness of breath or sweats. There are no associated agents to hypertension. Risk factors for coronary artery disease include dyslipidemia. The current treatment provides moderate improvement. There are no compliance problems.  There is no history of angina, kidney disease, CAD/MI, CVA, heart failure, left ventricular hypertrophy, PVD or retinopathy. There is no history of chronic renal disease, a hypertension causing med or renovascular disease.  Hyperlipidemia He has no history of chronic renal disease. Pertinent negatives include no chest pain or shortness of breath. Current antihyperlipidemic treatment includes statins. The current treatment provides moderate improvement of lipids. There are no compliance problems.   Seizures  This is a chronic problem. The current episode started more than 1 week ago. The problem has been gradually worsening. Pertinent negatives include no sleepiness, no confusion, no headaches, no speech difficulty, no visual disturbance, no neck stiffness, no sore throat, no chest pain, no cough, no nausea, no vomiting, no diarrhea and no muscle weakness. Characteristics do not include bowel incontinence or loss of consciousness.    Lab Results  Component Value Date   NA 134 09/19/2022   K 4.7 09/19/2022   CO2 23 09/19/2022   GLUCOSE 97 09/19/2022   BUN 15 09/19/2022   CREATININE 0.81 09/19/2022   CALCIUM 9.1 09/19/2022   EGFR 102 09/19/2022   GFRNONAA 95 01/08/2020   Lab Results  Component  Value Date   CHOL 215 (H) 03/20/2022   HDL 73 03/20/2022   LDLCALC 119 (H) 03/20/2022   TRIG 133 03/20/2022   CHOLHDL 2.8 02/20/2018   No results found for: "TSH" No results found for: "HGBA1C" Lab Results  Component Value Date   WBC 5.4 10/17/2022   HGB 12.9 (L) 10/17/2022   HCT 37.1 (L) 10/17/2022   MCV 90 10/17/2022   PLT 219 10/17/2022   Lab Results  Component Value Date   ALT 10 09/19/2022   AST 12 09/19/2022   ALKPHOS 47 09/19/2022   BILITOT <0.2 09/19/2022   No results found for: "25OHVITD2", "25OHVITD3", "VD25OH"   Review of Systems  Constitutional:  Negative for malaise/fatigue.  HENT:  Negative for sore throat.   Eyes:  Negative for blurred vision and visual disturbance.  Respiratory:  Negative for cough, choking, chest tightness, shortness of breath and wheezing.   Cardiovascular:  Negative for chest pain, palpitations, orthopnea, leg swelling and PND.  Gastrointestinal:  Negative for bowel incontinence, diarrhea, nausea and vomiting.  Musculoskeletal:  Negative for neck pain.  Neurological:  Positive for seizures. Negative for loss of consciousness, speech difficulty and headaches.  Psychiatric/Behavioral:  Negative for confusion.     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) 10/24/2021   History of cranial surgery 10/18/2021   Arthritis 07/11/2019   Taking medication for chronic disease 07/11/2019   Primary osteoarthritis of both  knees 05/24/2016   Hyperlipidemia 11/25/2015   pt has slurred speech and nervousness/ shaking  11/25/2015   Primary osteoarthritis of right knee 11/25/2015   Seizure (HCC) 12/03/2014   Essential hypertension 11/26/2014    No Known Allergies  Past Surgical History:   Procedure Laterality Date   RIB FRACTURE SURGERY      Social History   Tobacco Use   Smoking status: Never   Smokeless tobacco: Never  Vaping Use   Vaping status: Never Used  Substance Use Topics   Alcohol use: No    Alcohol/week: 0.0 standard drinks of alcohol   Drug use: No     Medication list has been reviewed and updated.  Current Meds  Medication Sig   acetaminophen (TYLENOL) 650 MG CR tablet Take 650 mg by mouth every 8 (eight) hours as needed for pain.   carbamazepine (TEGRETOL) 200 MG tablet TAKE 2 TABLETS THREE TIMES DAILY   divalproex (DEPAKOTE) 500 MG DR tablet Take 1 tablet (500 mg total) by mouth 3 (three) times daily.   lisinopril-hydrochlorothiazide (ZESTORETIC) 10-12.5 MG tablet TAKE 1 TABLET EVERY DAY   loratadine (CLARITIN) 10 MG tablet Take 10 mg by mouth daily.   lovastatin (MEVACOR) 40 MG tablet TAKE 1 TABLET AT BEDTIME   meloxicam (MOBIC) 7.5 MG tablet TAKE 1 TABLET EVERY DAY FOR SHOULDER PAIN   triamcinolone (NASACORT) 55 MCG/ACT AERO nasal inhaler Place 2 sprays into the nose daily. (Patient taking differently: Place 2 sprays into the nose as needed.)       03/22/2023   10:41 AM 09/19/2022   10:36 AM 03/20/2022   10:31 AM 06/23/2021    8:24 AM  GAD 7 : Generalized Anxiety Score  Nervous, Anxious, on Edge 1 0 0 0  Control/stop worrying 1 0 0 0  Worry too much - different things 1 0 0 0  Trouble relaxing 0 0 0 0  Restless 0 0 0 0  Easily annoyed or irritable 0 0 0 0  Afraid - awful might happen 0 0 0 0  Total GAD 7 Score 3 0 0 0  Anxiety Difficulty Not difficult at all Not difficult at all Not difficult at all Not difficult at all       03/22/2023   10:40 AM 11/08/2022    2:33 PM 09/19/2022   10:36 AM  Depression screen PHQ 2/9  Decreased Interest 0 0 0  Down, Depressed, Hopeless 0 1 0  PHQ - 2 Score 0 1 0  Altered sleeping 1 0 0  Tired, decreased energy 0 0 0  Change in appetite 1 0 0  Feeling bad or failure about yourself  0 0 0   Trouble concentrating 0 0 0  Moving slowly or fidgety/restless 0 0 0  Suicidal thoughts 0 0 0  PHQ-9 Score 2 1 0  Difficult doing work/chores  Not difficult at all Not difficult at all    BP Readings from Last 3 Encounters:  03/22/23 122/82  09/19/22 120/62  03/20/22 118/78    Physical Exam Vitals and nursing note reviewed.  HENT:     Head: Normocephalic.     Right Ear: External ear normal.     Left Ear: External ear normal.     Nose: Nose normal.  Eyes:     General: No scleral icterus.       Right eye: No discharge.        Left eye: No discharge.     Conjunctiva/sclera: Conjunctivae normal.  Pupils: Pupils are equal, round, and reactive to light.  Neck:     Thyroid: No thyromegaly.     Vascular: No JVD.     Trachea: No tracheal deviation.  Cardiovascular:     Rate and Rhythm: Normal rate and regular rhythm.     Heart sounds: Normal heart sounds. No murmur heard.    No friction rub. No gallop.  Pulmonary:     Effort: No respiratory distress.     Breath sounds: Normal breath sounds. No wheezing or rales.  Abdominal:     General: Bowel sounds are normal.     Palpations: Abdomen is soft. There is no mass.     Tenderness: There is no abdominal tenderness. There is no guarding or rebound.  Musculoskeletal:        General: No tenderness. Normal range of motion.     Cervical back: Normal range of motion and neck supple.  Lymphadenopathy:     Cervical: No cervical adenopathy.  Skin:    General: Skin is warm.     Findings: No rash.  Neurological:     Mental Status: He is alert and oriented to person, place, and time.     Cranial Nerves: No cranial nerve deficit.     Deep Tendon Reflexes: Reflexes are normal and symmetric.     Wt Readings from Last 3 Encounters:  03/22/23 210 lb (95.3 kg)  11/08/22 208 lb (94.3 kg)  09/19/22 208 lb (94.3 kg)    BP 122/82   Pulse 63   Ht 5\' 11"  (1.803 m)   Wt 210 lb (95.3 kg)   SpO2 97%   BMI 29.29 kg/m   Assessment and  Plan:  1. Essential hypertension Chronic.  Controlled.  Stable.  Blood pressure today 122/82.  Asymptomatic.  Tolerating medication well.  Continue lisinopril hydrochlorothiazide 10-12.5 mg once a day.  Review of past medical history is unremarkable.  Will check CMP for electrolytes and GFR. - lisinopril-hydrochlorothiazide (ZESTORETIC) 10-12.5 MG tablet; Take 1 tablet by mouth daily.  Dispense: 90 tablet; Refill: 1  2. Seizure disorder (HCC) Chronic.  Controlled.  Stable.  Patient is seizure-free without breakthrough.  Will continue combination of Depakote 500 mg 3 times a day and Tegretol 200 mg 3 times a day as well.  Will recheck patient in 6 months. - divalproex (DEPAKOTE) 500 MG DR tablet; Take 1 tablet (500 mg total) by mouth 3 (three) times daily.  Dispense: 270 tablet; Refill: 1 - carbamazepine (TEGRETOL) 200 MG tablet; TAKE 2 TABLETS THREE TIMES DAILY  Dispense: 540 tablet; Refill: 1  3. Other hyperlipidemia Chronic.  Controlled.  Stable.  Tolerating statin preparation/Mevacor 40 mg nightly without symptomatology such as myalgias.  Will recheck patient on in 6 months. - lovastatin (MEVACOR) 40 MG tablet; Take 1 tablet (40 mg total) by mouth at bedtime.  Dispense: 90 tablet; Refill: 1 - Comprehensive metabolic panel  4. Low hemoglobin Patient with slightly decreased hemoglobin in the past.  Symptomatology and physical findings are unremarkable for anemia.  However given the history we will check CBC to see as this is further stabilized. - CBC with Differential/Platelet  5. Taking medication for chronic disease Discussed and administered - Comprehensive metabolic panel    Elizabeth Sauer, MD

## 2023-03-23 LAB — COMPREHENSIVE METABOLIC PANEL
ALT: 8 [IU]/L (ref 0–44)
AST: 14 [IU]/L (ref 0–40)
Albumin: 4.4 g/dL (ref 3.8–4.9)
Alkaline Phosphatase: 47 [IU]/L (ref 44–121)
BUN/Creatinine Ratio: 20 (ref 10–24)
BUN: 18 mg/dL (ref 8–27)
Bilirubin Total: 0.2 mg/dL (ref 0.0–1.2)
CO2: 25 mmol/L (ref 20–29)
Calcium: 9.4 mg/dL (ref 8.6–10.2)
Chloride: 99 mmol/L (ref 96–106)
Creatinine, Ser: 0.92 mg/dL (ref 0.76–1.27)
Globulin, Total: 2.2 g/dL (ref 1.5–4.5)
Glucose: 93 mg/dL (ref 70–99)
Potassium: 4.7 mmol/L (ref 3.5–5.2)
Sodium: 139 mmol/L (ref 134–144)
Total Protein: 6.6 g/dL (ref 6.0–8.5)
eGFR: 95 mL/min/{1.73_m2} (ref >59)

## 2023-03-23 LAB — CBC WITH DIFFERENTIAL/PLATELET
Basophils Absolute: 0.1 10*3/uL (ref 0.0–0.2)
Basos: 2 %
EOS (ABSOLUTE): 0.3 10*3/uL (ref 0.0–0.4)
Eos: 6 %
Hematocrit: 38.8 % (ref 37.5–51.0)
Hemoglobin: 13 g/dL (ref 13.0–17.7)
Immature Grans (Abs): 0 10*3/uL (ref 0.0–0.1)
Immature Granulocytes: 1 %
Lymphocytes Absolute: 1.5 10*3/uL (ref 0.7–3.1)
Lymphs: 37 %
MCH: 31.6 pg (ref 26.6–33.0)
MCHC: 33.5 g/dL (ref 31.5–35.7)
MCV: 94 fL (ref 79–97)
Monocytes Absolute: 0.6 10*3/uL (ref 0.1–0.9)
Monocytes: 14 %
Neutrophils Absolute: 1.7 10*3/uL (ref 1.4–7.0)
Neutrophils: 40 %
Platelets: 234 10*3/uL (ref 150–450)
RBC: 4.11 x10E6/uL — ABNORMAL LOW (ref 4.14–5.80)
RDW: 12.9 % (ref 11.6–15.4)
WBC: 4.1 10*3/uL (ref 3.4–10.8)

## 2023-03-23 IMAGING — CR DG CHEST 2V
3 series · 3 of 3 positions shown · non-contrast
Comparison: Dated April 07, 2018. Chest x-ray

CLINICAL DATA: Cough and fever

EXAM:
CHEST - 2 VIEW

[chest pa]
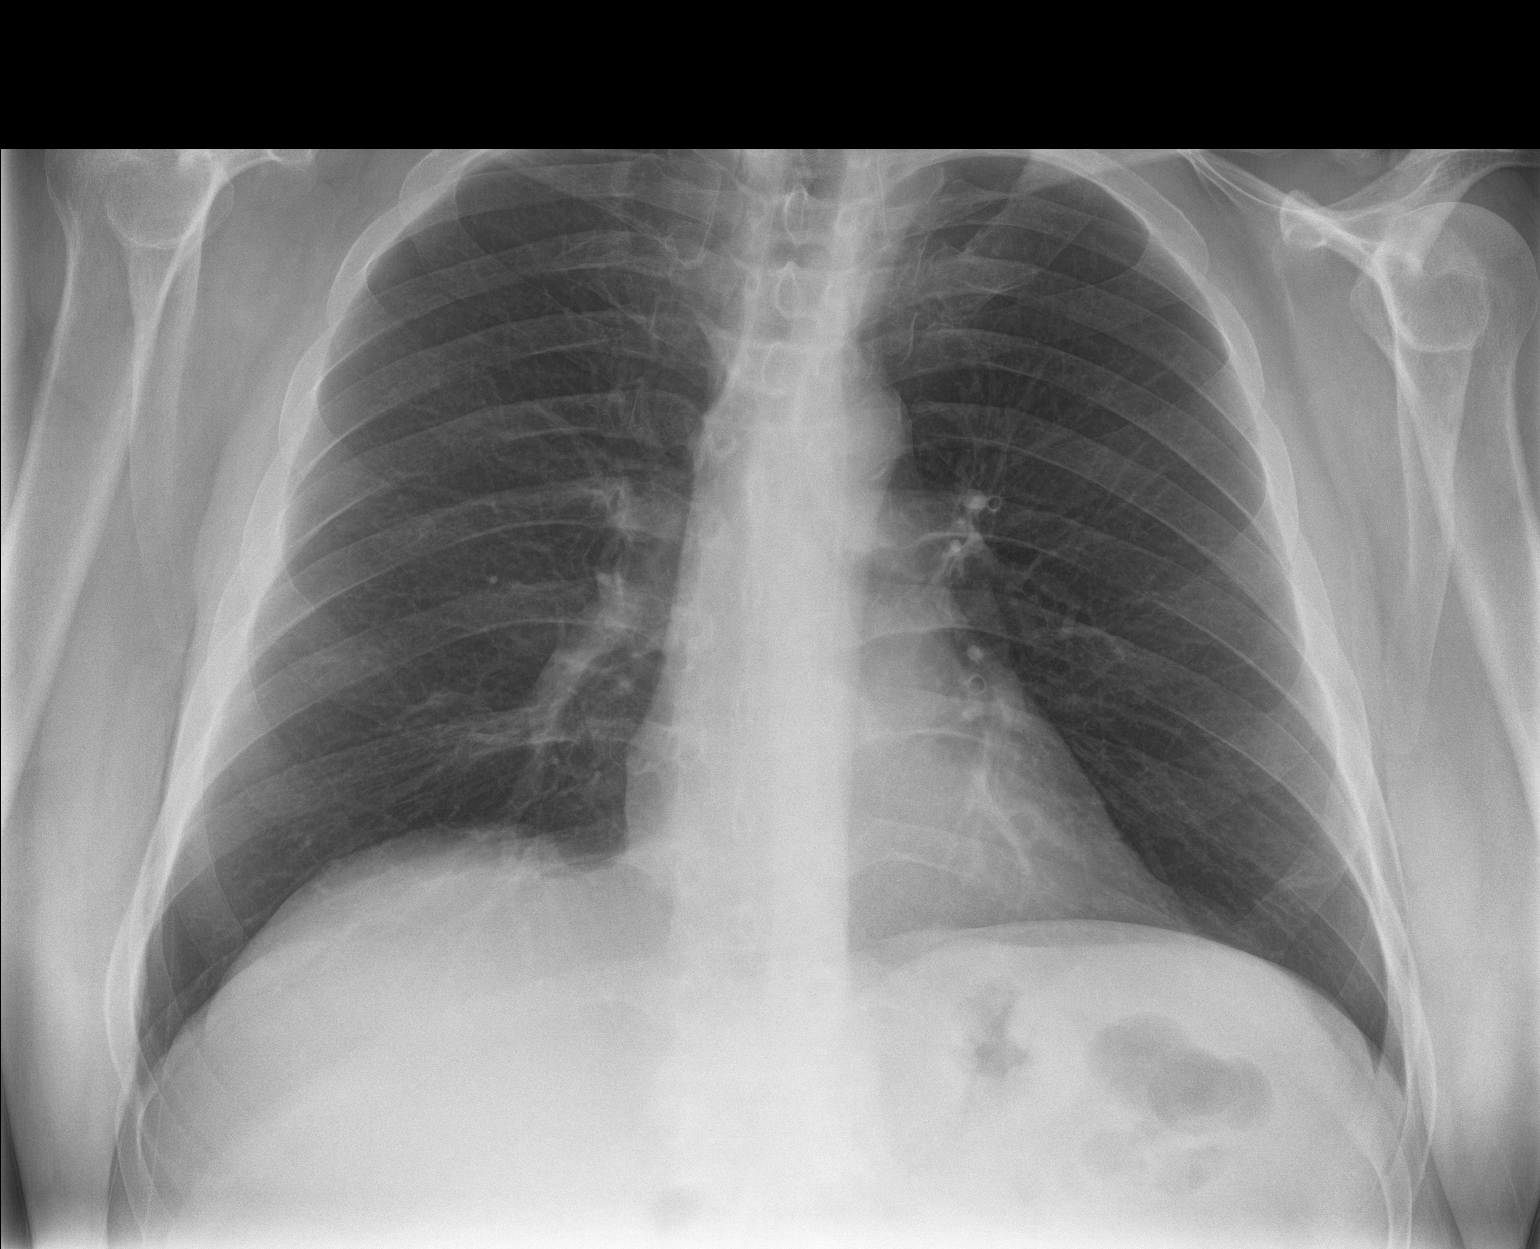

[chest lat (1 of 2)]
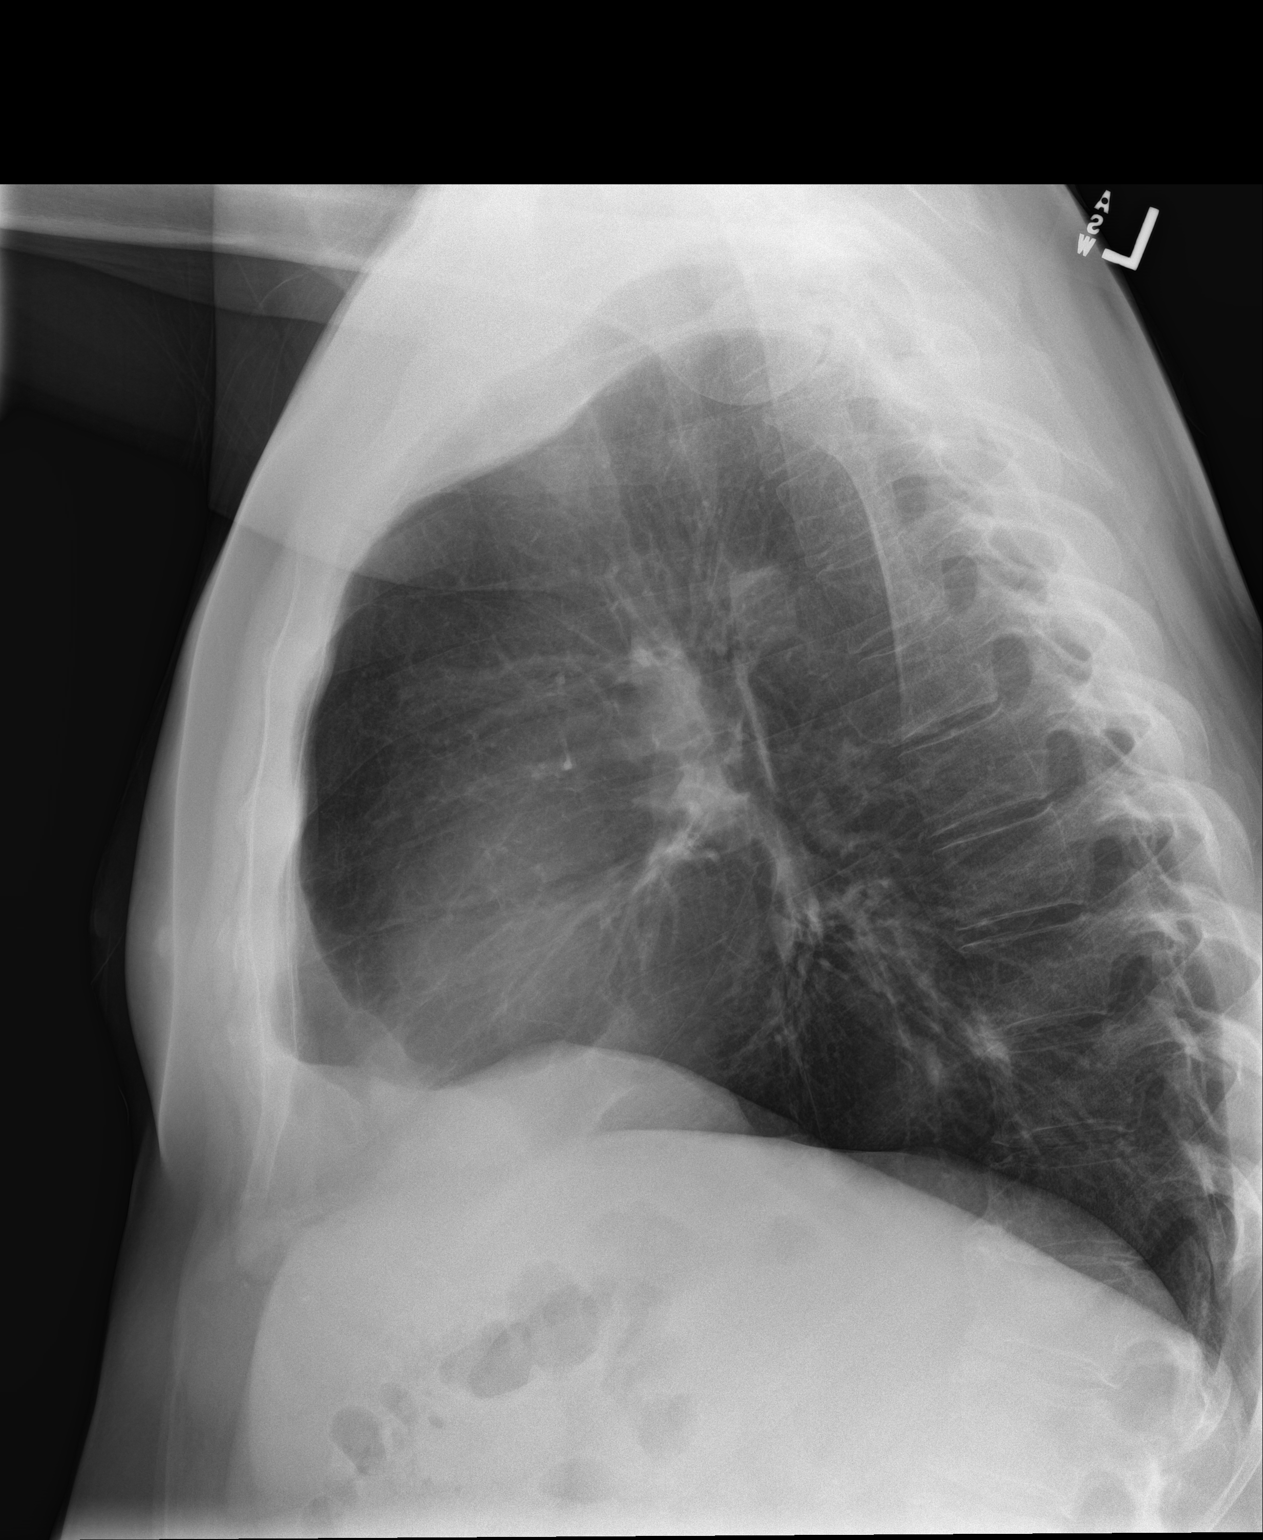

[chest lat (2 of 2)]
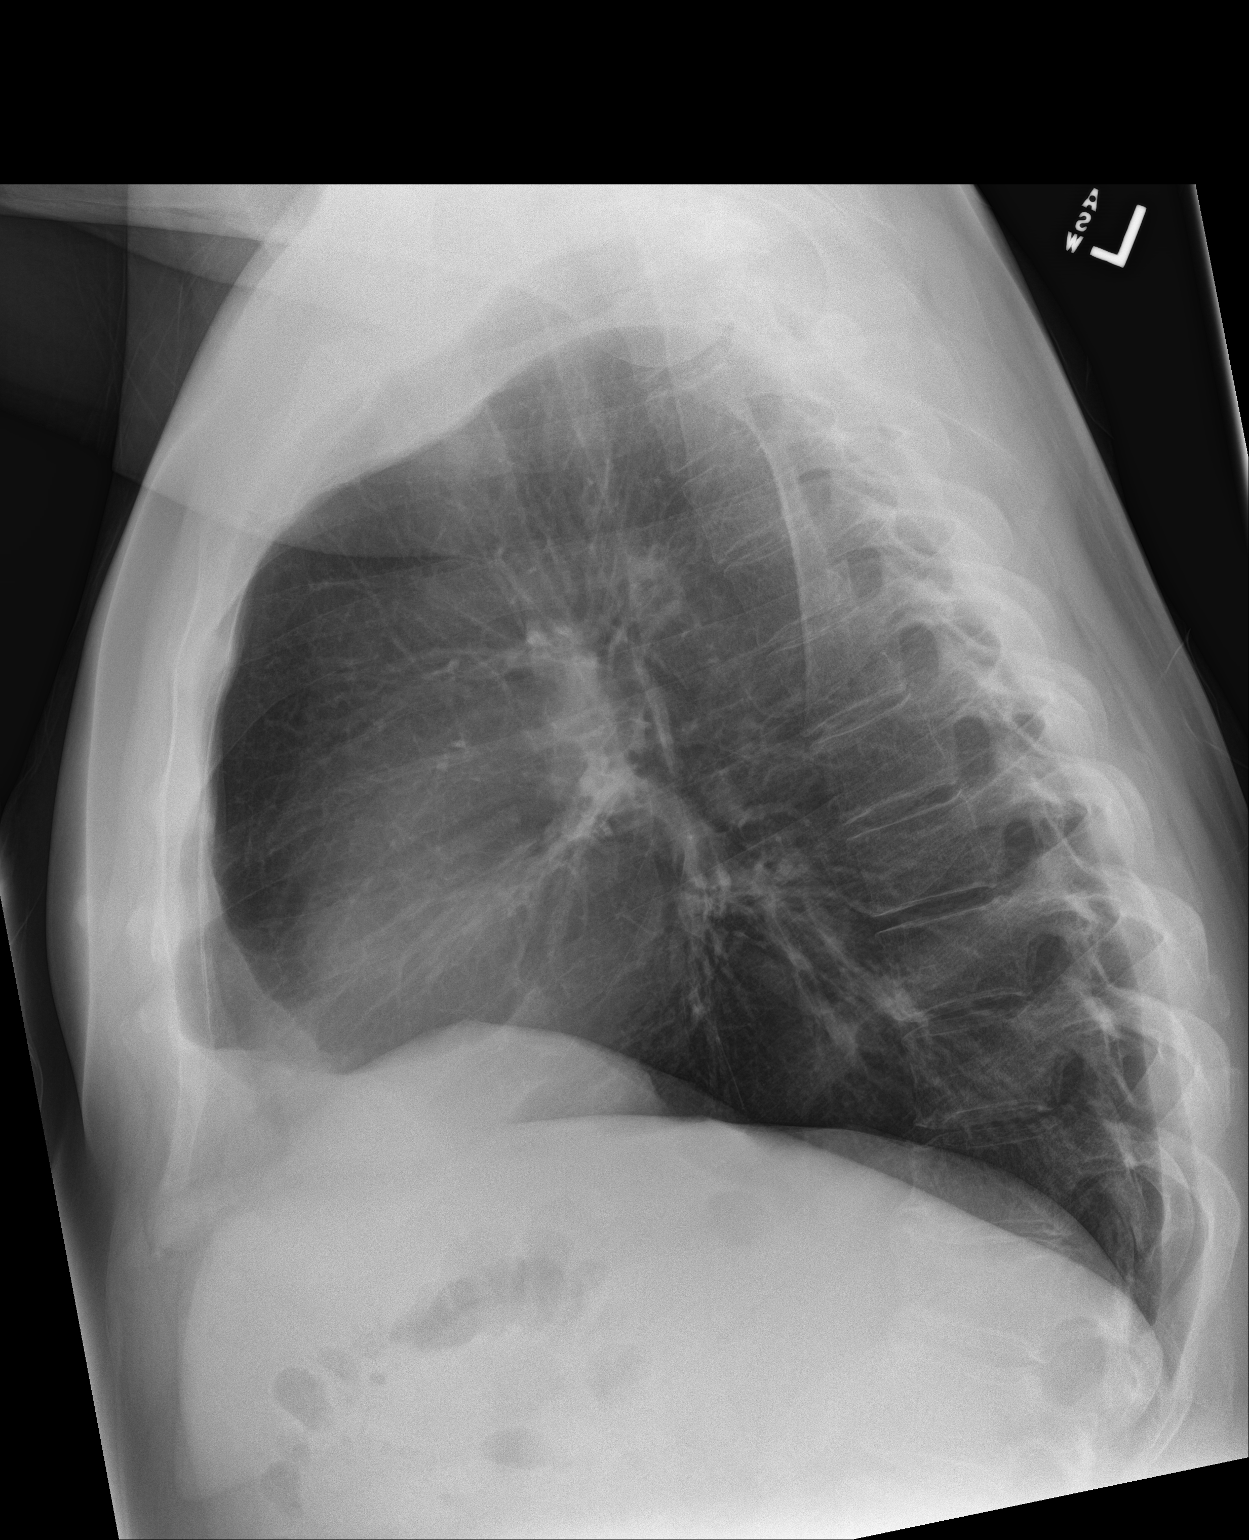

[3 of 3 positions shown; findings below may reference images not displayed]

FINDINGS: The heart size and mediastinal contours are within normal limits.
Both lungs are clear. The visualized skeletal structures are
unremarkable.
IMPRESSION: No active cardiopulmonary disease.

## 2023-04-10 DIAGNOSIS — S069X9A Unspecified intracranial injury with loss of consciousness of unspecified duration, initial encounter: Secondary | ICD-10-CM | POA: Diagnosis not present

## 2023-04-10 DIAGNOSIS — H40003 Preglaucoma, unspecified, bilateral: Secondary | ICD-10-CM | POA: Diagnosis not present

## 2023-04-10 DIAGNOSIS — H2513 Age-related nuclear cataract, bilateral: Secondary | ICD-10-CM | POA: Diagnosis not present

## 2023-04-10 DIAGNOSIS — Z0100 Encounter for examination of eyes and vision without abnormal findings: Secondary | ICD-10-CM | POA: Diagnosis not present

## 2023-04-10 DIAGNOSIS — H43391 Other vitreous opacities, right eye: Secondary | ICD-10-CM | POA: Diagnosis not present

## 2023-07-02 ENCOUNTER — Telehealth: Payer: Self-pay | Admitting: Family Medicine

## 2023-07-02 NOTE — Telephone Encounter (Signed)
Patient came into office and I tried to call [facility] but they are closed for the holiday today. Will try again tomorrow to see if patient can continue medications with them.  Will call patient tomorrow after speaking with the pharmacy.

## 2023-07-02 NOTE — Telephone Encounter (Signed)
Patient called and wants to know if medications are still by covered by [facility]. Please give pt a call. Patient still wants to use [facility].

## 2023-07-03 NOTE — Telephone Encounter (Signed)
Called [facility] today. Pharm tech said meds were mailed to patient on 01/16 so he should be receiving soon. Tried calling pt and no answer to inform.   - Kairi Harshbarger

## 2023-08-11 ENCOUNTER — Other Ambulatory Visit: Payer: Self-pay | Admitting: Family Medicine

## 2023-08-11 DIAGNOSIS — M7591 Shoulder lesion, unspecified, right shoulder: Secondary | ICD-10-CM

## 2023-08-23 ENCOUNTER — Encounter: Payer: Self-pay | Admitting: Family Medicine

## 2023-08-23 ENCOUNTER — Ambulatory Visit (INDEPENDENT_AMBULATORY_CARE_PROVIDER_SITE_OTHER): Payer: Self-pay | Admitting: Family Medicine

## 2023-08-23 VITALS — BP 124/82 | HR 67 | Ht 71.0 in | Wt 214.4 lb

## 2023-08-23 DIAGNOSIS — R7989 Other specified abnormal findings of blood chemistry: Secondary | ICD-10-CM

## 2023-08-23 DIAGNOSIS — I10 Essential (primary) hypertension: Secondary | ICD-10-CM | POA: Diagnosis not present

## 2023-08-23 DIAGNOSIS — G40909 Epilepsy, unspecified, not intractable, without status epilepticus: Secondary | ICD-10-CM

## 2023-08-23 DIAGNOSIS — R69 Illness, unspecified: Secondary | ICD-10-CM | POA: Diagnosis not present

## 2023-08-23 DIAGNOSIS — E7849 Other hyperlipidemia: Secondary | ICD-10-CM

## 2023-08-23 MED ORDER — CARBAMAZEPINE 200 MG PO TABS: ORAL_TABLET | ORAL | 1 refills | Status: AC

## 2023-08-23 MED ORDER — DIVALPROEX SODIUM 500 MG PO DR TAB: 500.0000 mg | DELAYED_RELEASE_TABLET | Freq: Three times a day (TID) | ORAL | 1 refills | Status: AC

## 2023-08-23 MED ORDER — LOVASTATIN 40 MG PO TABS: 40.0000 mg | ORAL_TABLET | Freq: Every day | ORAL | 1 refills | Status: AC

## 2023-08-23 MED ORDER — LISINOPRIL-HYDROCHLOROTHIAZIDE 10-12.5 MG PO TABS: 1.0000 | ORAL_TABLET | Freq: Every day | ORAL | 1 refills | Status: AC

## 2023-08-23 NOTE — Progress Notes (Signed)
 Date:  08/23/2023   Name:  Jerome Gilmore   DOB:  [birth year]   MRN:  [id number]   Chief Complaint: Hypertension [name] presents today for a follow up on his HTN. He has been taking his medications as directed and has no concerns for today's visit. He  would also like a RX for Silver sulfadiazine cream 1%)  Hypertension This is a chronic problem. The current episode started more than 1 year ago. The problem has been gradually improving since onset. The problem is controlled. Pertinent negatives include no anxiety, blurred vision, chest pain, headaches, malaise/fatigue, neck pain, orthopnea, palpitations, peripheral edema, PND, shortness of breath or sweats. There are no associated agents to hypertension. Risk factors for coronary artery disease include dyslipidemia. Past treatments include ACE inhibitors and diuretics. The current treatment provides moderate improvement. There are no compliance problems.  There is no history of CAD/MI or CVA. There is no history of chronic renal disease, a hypertension causing med or renovascular disease.  Hyperlipidemia This is a chronic problem. The current episode started more than 1 year ago. The problem is controlled. Recent lipid tests were reviewed and are normal. He has no history of chronic renal disease. Pertinent negatives include no chest pain, myalgias or shortness of breath. Current antihyperlipidemic treatment includes statins. The current treatment provides moderate improvement of lipids. There are no compliance problems.  Risk factors for coronary artery disease include diabetes mellitus, dyslipidemia and hypertension.    Lab Results  Component Value Date   NA 139 03/22/2023   K 4.7 03/22/2023   CO2 25 03/22/2023   GLUCOSE 93 03/22/2023   BUN 18 03/22/2023   CREATININE 0.92 03/22/2023   CALCIUM 9.4 03/22/2023   EGFR 95 03/22/2023   GFRNONAA 95 01/08/2020   Lab Results  Component Value Date   CHOL 215 (H) 03/20/2022   HDL 73 03/20/2022    LDLCALC 119 (H) 03/20/2022   TRIG 133 03/20/2022   CHOLHDL 2.8 02/20/2018   No results found for: "TSH" No results found for: "HGBA1C" Lab Results  Component Value Date   WBC 4.1 03/22/2023   HGB 13.0 03/22/2023   HCT 38.8 03/22/2023   MCV 94 03/22/2023   PLT 234 03/22/2023   Lab Results  Component Value Date   ALT 8 03/22/2023   AST 14 03/22/2023   ALKPHOS 47 03/22/2023   BILITOT 0.2 03/22/2023   No results found for: "25OHVITD2", "25OHVITD3", "VD25OH"   Review of Systems  Constitutional:  Negative for malaise/fatigue.  HENT:  Negative for hearing loss, postnasal drip, sinus pain and sore throat.   Eyes:  Negative for blurred vision.  Respiratory:  Negative for chest tightness, shortness of breath and wheezing.   Cardiovascular:  Negative for chest pain, palpitations, orthopnea and PND.  Endocrine: Negative for polydipsia and polyuria.  Musculoskeletal:  Negative for arthralgias, myalgias and neck pain.  Neurological:  Negative for headaches.    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) 10/24/2021   History of cranial surgery 10/18/2021   Arthritis 07/11/2019   Taking medication for chronic disease 07/11/2019   Primary osteoarthritis of both knees 05/24/2016   Hyperlipidemia 11/25/2015   pt has slurred speech and nervousness/ shaking  11/25/2015   Primary  osteoarthritis of right knee 11/25/2015   Seizure (HCC) 12/03/2014   Essential hypertension 11/26/2014    No Known Allergies  Past Surgical History:  Procedure Laterality Date   RIB FRACTURE SURGERY      Social History   Tobacco Use   Smoking status: Never   Smokeless tobacco: Never  Vaping Use   Vaping status: Never Used  Substance Use Topics    Alcohol use: No    Alcohol/week: 0.0 standard drinks of alcohol   Drug use: No     Medication list has been reviewed and updated.  Current Meds  Medication Sig   acetaminophen (TYLENOL) 650 MG CR tablet Take 650 mg by mouth every 8 (eight) hours as needed for pain.   carbamazepine (TEGRETOL) 200 MG tablet TAKE 2 TABLETS THREE TIMES DAILY   divalproex (DEPAKOTE) 500 MG DR tablet Take 1 tablet (500 mg total) by mouth 3 (three) times daily.   lisinopril-hydrochlorothiazide (ZESTORETIC) 10-12.5 MG tablet Take 1 tablet by mouth daily.   loratadine (CLARITIN) 10 MG tablet Take 10 mg by mouth daily.   lovastatin (MEVACOR) 40 MG tablet Take 1 tablet (40 mg total) by mouth at bedtime.   meloxicam (MOBIC) 7.5 MG tablet TAKE 1 TABLET EVERY DAY FOR SHOULDER PAIN   triamcinolone (NASACORT) 55 MCG/ACT AERO nasal inhaler Place 2 sprays into the nose daily. (Patient taking differently: Place 2 sprays into the nose as needed.)       08/23/2023   10:53 AM 03/22/2023   10:41 AM 09/19/2022   10:36 AM 03/20/2022   10:31 AM  GAD 7 : Generalized Anxiety Score  Nervous, Anxious, on Edge 1 1 0 0  Control/stop worrying 1 1 0 0  Worry too much - different things 0 1 0 0  Trouble relaxing 0 0 0 0  Restless 0 0 0 0  Easily annoyed or irritable 1 0 0 0  Afraid - awful might happen 0 0 0 0  Total GAD 7 Score 3 3 0 0  Anxiety Difficulty Not difficult at all Not difficult at all Not difficult at all Not difficult at all       08/23/2023   10:52 AM 03/22/2023   10:40 AM 11/08/2022    2:33 PM  Depression screen PHQ 2/9  Decreased Interest 0 0 0  Down, Depressed, Hopeless 1 0 1  PHQ - 2 Score 1 0 1  Altered sleeping 1 1 0  Tired, decreased energy 1 0 0  Change in appetite 0 1 0  Feeling bad or failure about yourself  2 0 0  Trouble concentrating 1 0 0  Moving slowly or fidgety/restless 1 0 0  Suicidal thoughts 0 0 0  PHQ-9 Score 7 2 1   Difficult doing work/chores Somewhat difficult  Not difficult at  all    BP Readings from Last 3 Encounters:  08/23/23 124/82  03/22/23 122/82  09/19/22 120/62    Physical Exam Vitals and nursing note reviewed.  Constitutional:      Appearance: He is well-developed.  HENT:     Head: Normocephalic and atraumatic.     Right Ear: Tympanic membrane, ear canal and external ear normal.     Left Ear: Tympanic membrane, ear canal and external ear normal.     Nose: Nose normal. No congestion or rhinorrhea.     Mouth/Throat:     Dentition: Normal dentition.  Eyes:     General: Lids are normal. No scleral icterus.    Conjunctiva/sclera: Conjunctivae normal.  Pupils: Pupils are equal, round, and reactive to light.  Neck:     Thyroid: No thyromegaly.     Vascular: No carotid bruit, hepatojugular reflux or JVD.     Trachea: No tracheal deviation.  Cardiovascular:     Rate and Rhythm: Normal rate and regular rhythm.     Heart sounds: Normal heart sounds. No murmur heard.    No friction rub. No gallop.  Pulmonary:     Effort: Pulmonary effort is normal.     Breath sounds: Normal breath sounds. No wheezing, rhonchi or rales.  Abdominal:     General: Bowel sounds are normal.     Palpations: Abdomen is soft. There is no hepatomegaly, splenomegaly or mass.     Tenderness: There is no abdominal tenderness.     Hernia: There is no hernia in the left inguinal area.  Musculoskeletal:        General: Normal range of motion.     Cervical back: Normal range of motion and neck supple.  Lymphadenopathy:     Cervical: No cervical adenopathy.  Skin:    General: Skin is warm and dry.     Findings: No rash.  Neurological:     Mental Status: He is alert and oriented to person, place, and time.     Sensory: No sensory deficit.     Deep Tendon Reflexes: Reflexes are normal and symmetric.  Psychiatric:        Mood and Affect: Mood is not anxious or depressed.     Wt Readings from Last 3 Encounters:  08/23/23 214 lb 6.4 oz (97.3 kg)  03/22/23 210 lb (95.3  kg)  11/08/22 208 lb (94.3 kg)    BP 124/82   Pulse 67   Ht 5\' 11"  (1.803 m)   Wt 214 lb 6.4 oz (97.3 kg)   SpO2 98%   BMI 29.90 kg/m   Assessment and Plan:  1. Essential hypertension (Primary) Chronic.  Controlled.  Stable.  Blood pressure 124/82.  Asymptomatic.  Tolerating medication well.  Will continue lisinopril hydrochlorothiazide 10-12.5 mg once a day.  Will obtain CMP for electrolytes and GFR.  Will recheck patient in 6 months. - Comprehensive metabolic panel - lisinopril-hydrochlorothiazide (ZESTORETIC) 10-12.5 MG tablet; Take 1 tablet by mouth daily.  Dispense: 90 tablet; Refill: 1  2. Seizure disorder (HCC) Chronic.  Controlled.  Stable.  Currently is controlled with valproic acid and carbamazepine.  Patient is tolerating these medications well and has for several years.  We will continue with current dosing of carbamazepine 200 mg 2 tablets 3 times a day and Depakote 500 mg 3 times a day.  Will recheck patient in 6 months.  In the meantime we will obtain a valproic acid level and a carbamazepine level to see where her levels are at this time. - Valproic Acid level - Carbamazepine Level (Tegretol), total - carbamazepine (TEGRETOL) 200 MG tablet; TAKE 2 TABLETS THREE TIMES DAILY  Dispense: 540 tablet; Refill: 1 - divalproex (DEPAKOTE) 500 MG DR tablet; Take 1 tablet (500 mg total) by mouth 3 (three) times daily.  Dispense: 270 tablet; Refill: 1  3. Other hyperlipidemia Chronic.  Controlled.  Stable.  Asymptomatic.  Tolerating lovastatin 40 mg once a day.  Patient denies any myalgias or muscle weakness and we will check CMP for hepatic concerns and lipid panel for current level of LDL control.  Chronic.  Controlled.  Stable. - Comprehensive metabolic panel - Lipid Panel With LDL/HDL Ratio - lovastatin (MEVACOR) 40 MG tablet;  Take 1 tablet (40 mg total) by mouth at bedtime.  Dispense: 90 tablet; Refill: 1  4. Taking medication for chronic disease Chronic.  Controlled.   Stable.  Will check levels at this time as well as liver CBC.  Chronic - Valproic Acid level - Carbamazepine Level (Tegretol), total  5. Abnormal CBC Relatively new onset with decreased hemoglobin and low normal on last reading.  Will check hemoglobin for current level of control. - Hemoglobin  6. Seizure disorder (HCC) As noted above - Valproic Acid level - Carbamazepine Level (Tegretol), total - carbamazepine (TEGRETOL) 200 MG tablet; TAKE 2 TABLETS THREE TIMES DAILY  Dispense: 540 tablet; Refill: 1 - divalproex (DEPAKOTE) 500 MG DR tablet; Take 1 tablet (500 mg total) by mouth 3 (three) times daily.  Dispense: 270 tablet; Refill: 1   Elizabeth Sauer, MD

## 2023-08-24 LAB — COMPREHENSIVE METABOLIC PANEL
ALT: 8 IU/L (ref 0–44)
AST: 14 IU/L (ref 0–40)
Albumin: 4.3 g/dL (ref 3.8–4.9)
Alkaline Phosphatase: 50 IU/L (ref 44–121)
BUN/Creatinine Ratio: 20 (ref 10–24)
BUN: 18 mg/dL (ref 8–27)
Bilirubin Total: <0.2 mg/dL (ref 0.0–1.2)
CO2: 26 mmol/L (ref 20–29)
Calcium: 9 mg/dL (ref 8.6–10.2)
Chloride: 96 mmol/L (ref 96–106)
Creatinine, Ser: 0.91 mg/dL (ref 0.76–1.27)
Globulin, Total: 2.3 g/dL (ref 1.5–4.5)
Glucose: 92 mg/dL (ref 70–99)
Potassium: 4.6 mmol/L (ref 3.5–5.2)
Sodium: 136 mmol/L (ref 134–144)
Total Protein: 6.6 g/dL (ref 6.0–8.5)
eGFR: 96 mL/min/{1.73_m2} (ref >59)

## 2023-08-24 LAB — LIPID PANEL WITH LDL/HDL RATIO
Cholesterol, Total: 214 mg/dL — ABNORMAL HIGH (ref 100–199)
HDL: 78 mg/dL (ref >39)
LDL Chol Calc (NIH): 109 mg/dL — ABNORMAL HIGH (ref 0–99)
LDL/HDL Ratio: 1.4 ratio (ref 0.0–3.6)
Triglycerides: 159 mg/dL — ABNORMAL HIGH (ref 0–149)
VLDL Cholesterol Cal: 27 mg/dL (ref 5–40)

## 2023-08-24 LAB — VALPROIC ACID LEVEL: Valproic Acid Lvl: 86 ug/mL (ref 50–100)

## 2023-08-24 LAB — CARBAMAZEPINE LEVEL, TOTAL: Carbamazepine (Tegretol), S: 13.2 ug/mL (ref 4.0–12.0)

## 2023-08-24 LAB — HEMOGLOBIN: Hemoglobin: 13.3 g/dL (ref 13.0–17.7)

## 2023-08-25 ENCOUNTER — Telehealth: Payer: Self-pay | Admitting: Family Medicine

## 2023-08-25 ENCOUNTER — Other Ambulatory Visit: Payer: Self-pay | Admitting: Family Medicine

## 2023-08-25 DIAGNOSIS — G40909 Epilepsy, unspecified, not intractable, without status epilepticus: Secondary | ICD-10-CM

## 2023-08-25 NOTE — Telephone Encounter (Signed)
 Patient called concerning his lab work and his elevated Tegretol.  Patient takes his medication 3 times in the morning and he had been taking it just prior to seeing me in the clinic that morning.  However since I will be retiring I would like to get him involved with neurology and spoke with him about arranging to have a neurology consult with [facility] neurology particularly if they come to [facility] which would be very convenient for him to see and that he does not like to travel given his handicap.  We will submit a request for

## 2023-10-30 ENCOUNTER — Other Ambulatory Visit: Payer: Self-pay | Admitting: Surgery

## 2023-11-07 ENCOUNTER — Other Ambulatory Visit: Payer: Self-pay

## 2023-11-07 ENCOUNTER — Encounter
Admission: RE | Admit: 2023-11-07 | Discharge: 2023-11-07 | Disposition: A | Discharge status: Home / Self Care | Source: Ambulatory Visit | Attending: Surgery | Admitting: Surgery

## 2023-11-07 VITALS — Wt 216.0 lb

## 2023-11-07 DIAGNOSIS — Z01812 Encounter for preprocedural laboratory examination: Secondary | ICD-10-CM

## 2023-11-07 DIAGNOSIS — Z79899 Other long term (current) drug therapy: Secondary | ICD-10-CM

## 2023-11-07 DIAGNOSIS — Z01810 Encounter for preprocedural cardiovascular examination: Secondary | ICD-10-CM

## 2023-11-07 DIAGNOSIS — I10 Essential (primary) hypertension: Secondary | ICD-10-CM

## 2023-11-07 HISTORY — DX: Tremor, unspecified: R25.1

## 2023-11-07 HISTORY — DX: Personal history of other (healed) physical injury and trauma: Z87.828

## 2023-11-07 HISTORY — DX: Other specified mental disorders due to known physiological condition: F06.8

## 2023-11-07 HISTORY — DX: Bilateral primary osteoarthritis of knee: M17.0

## 2023-11-07 HISTORY — DX: Other specified postprocedural states: Z98.890

## 2023-11-07 HISTORY — DX: Epilepsy, unspecified, not intractable, without status epilepticus: G40.909

## 2023-11-07 HISTORY — DX: Palmar fascial fibromatosis (dupuytren): M72.0

## 2023-11-07 NOTE — Patient Instructions (Addendum)
 Your procedure is scheduled on:11-14-23 Wednesday Report to the Registration Desk on the 1st floor of the [facility].Then proceed to the 2nd floor Surgery Desk To find out your arrival time, please call [phone] between 1PM - 3PM on:11-13-23 Tuesday If your arrival time is 6:00 am, do not arrive before that time as the [facility] entrance doors do not open until 6:00 am.  REMEMBER: Instructions that are not followed completely may result in serious medical risk, up to and including death; or upon the discretion of your surgeon and anesthesiologist your surgery may need to be rescheduled.  Do not eat food after midnight the night before surgery.  No gum chewing or hard candies.  You may however, drink CLEAR liquids up to 2 hours before you are scheduled to arrive for your surgery. Do not drink anything within 2 hours of your scheduled arrival time.  Clear liquids include: - water  - apple juice without pulp - gatorade (not RED colors) - black coffee or tea (Do NOT add milk or creamers to the coffee or tea) Do NOT drink anything that is not on this list.  In addition, your doctor has ordered for you to drink the provided:  Gatorade G2 Drinking this carbohydrate drink up to two hours before surgery helps to reduce insulin resistance and improve patient outcomes. Please complete drinking 2 hours before scheduled arrival time.  One week prior to surgery:Stop NOW (11-07-23) Stop Anti-inflammatories (NSAIDS) such as meloxicam  (MOBIC ), Advil, Aleve , Ibuprofen, Motrin, Naproxen , Naprosyn  and Aspirin based products such as Excedrin, Goody's Powder, BC Powder. Stop ANY OVER THE COUNTER supplements until after surgery.  You may however, continue to take Tylenol if needed for pain up until the day of surgery.  Continue taking all of your other prescription medications up until the day of surgery.  ON THE DAY OF SURGERY ONLY TAKE THESE MEDICATIONS WITH SIPS OF WATER: -carbamazepine  (TEGRETOL )   -divalproex  (DEPAKOTE )  -loratadine (CLARITIN)   No Alcohol for 24 hours before or after surgery.  No Smoking including e-cigarettes for 24 hours before surgery.  No chewable tobacco products for at least 6 hours before surgery.  No nicotine patches on the day of surgery.  Do not use any "recreational" drugs for at least a week (preferably 2 weeks) before your surgery.  Please be advised that the combination of cocaine and anesthesia may have negative outcomes, up to and including death. If you test positive for cocaine, your surgery will be cancelled.  On the morning of surgery brush your teeth with toothpaste and water, you may rinse your mouth with mouthwash if you wish. Do not swallow any toothpaste or mouthwash.  Use CHG Soap as directed on instruction sheet.  Do not wear jewelry, make-up, hairpins, clips or nail polish.  For welded (permanent) jewelry: bracelets, anklets, waist bands, etc.  Please have this removed prior to surgery.  If it is not removed, there is a chance that hospital personnel will need to cut it off on the day of surgery.  Do not wear lotions, powders, or perfumes.   Do not shave body hair from the neck down 48 hours before surgery.  Contact lenses, hearing aids and dentures may not be worn into surgery.  Do not bring valuables to the hospital. [facility] is not responsible for any missing/lost belongings or valuables.    Notify your doctor if there is any change in your medical condition (cold, fever, infection).  Wear comfortable clothing (specific to your surgery  type) to the hospital.  After surgery, you can help prevent lung complications by doing breathing exercises.  Take deep breaths and cough every 1-2 hours. Your doctor may order a device called an Incentive Spirometer to help you take deep breaths. When coughing or sneezing, hold a pillow firmly against your incision with both hands. This is called "splinting." Doing this helps protect your  incision. It also decreases belly discomfort.  If you are being admitted to the hospital overnight, leave your suitcase in the car. After surgery it may be brought to your room.  In case of increased patient census, it may be necessary for you, the patient, to continue your postoperative care in the Same Day Surgery department.  If you are being discharged the day of surgery, you will not be allowed to drive home. You will need a responsible individual to drive you home and stay with you for 24 hours after surgery.   If you are taking public transportation, you will need to have a responsible individual with you.  Please call the Pre-admissions Testing Dept. at [phone] if you have any questions about these instructions.  Surgery Visitation Policy:  Patients having surgery or a procedure may have two visitors.  Children under the age of 24 must have an adult with them who is not the patient.     Preparing for Surgery with CHLORHEXIDINE GLUCONATE (CHG) Soap  Chlorhexidine Gluconate (CHG) Soap  o An antiseptic cleaner that kills germs and bonds with the skin to continue killing germs even after washing  o Used for showering the night before surgery and morning of surgery  Before surgery, you can play an important role by reducing the number of germs on your skin.  CHG (Chlorhexidine gluconate) soap is an antiseptic cleanser which kills germs and bonds with the skin to continue killing germs even after washing.  Please do not use if you have an allergy to CHG or antibacterial soaps. If your skin becomes reddened/irritated stop using the CHG.  1. Shower the NIGHT BEFORE SURGERY and the MORNING OF SURGERY with CHG soap.  2. If you choose to wash your hair, wash your hair first as usual with your normal shampoo.  3. After shampooing, rinse your hair and body thoroughly to remove the shampoo.  4. Use CHG as you would any other liquid soap. You can apply CHG directly to the skin  and wash gently with a scrungie or a clean washcloth.  5. Apply the CHG soap to your body only from the neck down. Do not use on open wounds or open sores. Avoid contact with your eyes, ears, mouth, and genitals (private parts). Wash face and genitals (private parts) with your normal soap.  6. Wash thoroughly, paying special attention to the area where your surgery will be performed.  7. Thoroughly rinse your body with warm water.  8. Do not shower/wash with your normal soap after using and rinsing off the CHG soap.  9. Pat yourself dry with a clean towel.  10. Wear clean pajamas to bed the night before surgery.  12. Place clean sheets on your bed the night of your first shower and do not sleep with pets.  13. Shower again with the CHG soap on the day of surgery prior to arriving at the hospital.  14. Do not apply any deodorants/lotions/powders.  15. Please wear clean clothes to the hospital.  How to Use an Incentive Spirometer An incentive spirometer is a tool that measures how  well you are filling your lungs with each breath. Learning to take long, deep breaths using this tool can help you keep your lungs clear and active. This may help to reverse or lessen your chance of developing breathing (pulmonary) problems, especially infection. You may be asked to use a spirometer: After a surgery. If you have a lung problem or a history of smoking. After a long period of time when you have been unable to move or be active. If the spirometer includes an indicator to show the highest number that you have reached, your health care provider or respiratory therapist will help you set a goal. Keep a log of your progress as told by your health care provider. What are the risks? Breathing too quickly may cause dizziness or cause you to pass out. Take your time so you do not get dizzy or light-headed. If you are in pain, you may need to take pain medicine before doing incentive spirometry. It is  harder to take a deep breath if you are having pain. How to use your incentive spirometer  Sit up on the edge of your bed or on a chair. Hold the incentive spirometer so that it is in an upright position. Before you use the spirometer, breathe out normally. Place the mouthpiece in your mouth. Make sure your lips are closed tightly around it. Breathe in slowly and as deeply as you can through your mouth, causing the piston or the ball to rise toward the top of the chamber. Hold your breath for 3-5 seconds, or for as long as possible. If the spirometer includes a coach indicator, use this to guide you in breathing. Slow down your breathing if the indicator goes above the marked areas. Remove the mouthpiece from your mouth and breathe out normally. The piston or ball will return to the bottom of the chamber. Rest for a few seconds, then repeat the steps 10 or more times. Take your time and take a few normal breaths between deep breaths so that you do not get dizzy or light-headed. Do this every 1-2 hours when you are awake. If the spirometer includes a goal marker to show the highest number you have reached (best effort), use this as a goal to work toward during each repetition. After each set of 10 deep breaths, cough a few times. This will help to make sure that your lungs are clear. If you have an incision on your chest or abdomen from surgery, place a pillow or a rolled-up towel firmly against the incision when you cough. This can help to reduce pain while taking deep breaths and coughing. General tips When you are able to get out of bed: Walk around often. Continue to take deep breaths and cough in order to clear your lungs. Keep using the incentive spirometer until your health care provider says it is okay to stop using it. If you have been in the hospital, you may be told to keep using the spirometer at home. Contact a health care provider if: You are having difficulty using the  spirometer. You have trouble using the spirometer as often as instructed. Your pain medicine is not giving enough relief for you to use the spirometer as told. You have a fever. Get help right away if: You develop shortness of breath. You develop a cough with bloody mucus from the lungs. You have fluid or blood coming from an incision site after you cough. Summary An incentive spirometer is a tool that can help  you learn to take long, deep breaths to keep your lungs clear and active. You may be asked to use a spirometer after a surgery, if you have a lung problem or a history of smoking, or if you have been inactive for a long period of time. Use your incentive spirometer as instructed every 1-2 hours while you are awake. If you have an incision on your chest or abdomen, place a pillow or a rolled-up towel firmly against your incision when you cough. This will help to reduce pain. Get help right away if you have shortness of breath, you cough up bloody mucus, or blood comes from your incision when you cough. This information is not intended to replace advice given to you by your health care provider. Make sure you discuss any questions you have with your health care provider. Document Revised: 04/06/2023 Document Reviewed: 04/06/2023 Elsevier Patient Education  2024 [employer].

## 2023-11-08 ENCOUNTER — Encounter
Admission: RE | Admit: 2023-11-08 | Discharge: 2023-11-08 | Disposition: A | Discharge status: Home / Self Care | Source: Ambulatory Visit | Attending: Surgery | Admitting: Surgery

## 2023-11-08 DIAGNOSIS — Z01818 Encounter for other preprocedural examination: Secondary | ICD-10-CM | POA: Diagnosis present

## 2023-11-08 DIAGNOSIS — Z01812 Encounter for preprocedural laboratory examination: Secondary | ICD-10-CM

## 2023-11-08 DIAGNOSIS — I10 Essential (primary) hypertension: Secondary | ICD-10-CM | POA: Diagnosis not present

## 2023-11-08 DIAGNOSIS — Z79899 Other long term (current) drug therapy: Secondary | ICD-10-CM | POA: Insufficient documentation

## 2023-11-08 DIAGNOSIS — R9431 Abnormal electrocardiogram [ECG] [EKG]: Secondary | ICD-10-CM | POA: Diagnosis not present

## 2023-11-08 DIAGNOSIS — Z01810 Encounter for preprocedural cardiovascular examination: Secondary | ICD-10-CM

## 2023-11-08 LAB — CBC
HCT: 36.6 % — ABNORMAL LOW (ref 39.0–52.0)
Hemoglobin: 13.1 g/dL (ref 13.0–17.0)
MCH: 32.3 pg (ref 26.0–34.0)
MCHC: 35.8 g/dL (ref 30.0–36.0)
MCV: 90.4 fL (ref 80.0–100.0)
Platelets: 213 10*3/uL (ref 150–400)
RBC: 4.05 MIL/uL — ABNORMAL LOW (ref 4.22–5.81)
RDW: 12.8 % (ref 11.5–15.5)
WBC: 4.1 10*3/uL (ref 4.0–10.5)
nRBC: 0 % (ref 0.0–0.2)

## 2023-11-08 LAB — BASIC METABOLIC PANEL WITH GFR
Anion gap: 9 (ref 5–15)
BUN: 15 mg/dL (ref 8–23)
CO2: 28 mmol/L (ref 22–32)
Calcium: 8.8 mg/dL — ABNORMAL LOW (ref 8.9–10.3)
Chloride: 99 mmol/L (ref 98–111)
Creatinine, Ser: 0.77 mg/dL (ref 0.61–1.24)
GFR, Estimated: >60 mL/min (ref >60)
Glucose, Bld: 100 mg/dL — ABNORMAL HIGH (ref 70–99)
Potassium: 3.9 mmol/L (ref 3.5–5.1)
Sodium: 136 mmol/L (ref 135–145)

## 2023-11-13 MED ORDER — CEFAZOLIN SODIUM-DEXTROSE 2-4 GM/100ML-% IV SOLN
2.0000 g | INTRAVENOUS | Status: AC
  Administered 2023-11-14: 2 g via INTRAVENOUS

## 2023-11-13 MED ORDER — LACTATED RINGERS IV SOLN: INTRAVENOUS | Status: DC

## 2023-11-13 MED ORDER — CHLORHEXIDINE GLUCONATE 0.12 % MT SOLN
15.0000 mL | Freq: Once | OROMUCOSAL | Status: AC
  Administered 2023-11-14: 15 mL via OROMUCOSAL

## 2023-11-13 MED ORDER — ORAL CARE MOUTH RINSE
15.0000 mL | Freq: Once | OROMUCOSAL | Status: AC
Start: 2023-11-13 — End: 2023-11-14

## 2023-11-14 ENCOUNTER — Other Ambulatory Visit: Payer: Self-pay

## 2023-11-14 ENCOUNTER — Ambulatory Visit: Payer: Medicare PPO

## 2023-11-14 ENCOUNTER — Ambulatory Visit
Admission: RE | Admit: 2023-11-14 | Discharge: 2023-11-14 | Disposition: A | Discharge status: Home / Self Care | Source: Ambulatory Visit | Attending: Surgery | Admitting: Surgery

## 2023-11-14 ENCOUNTER — Ambulatory Visit: Admitting: General Practice

## 2023-11-14 ENCOUNTER — Encounter
Admission: RE | Disposition: A | Discharge status: Home / Self Care | Payer: Self-pay | Source: Ambulatory Visit | Attending: Surgery

## 2023-11-14 ENCOUNTER — Encounter: Payer: Self-pay | Admitting: Surgery

## 2023-11-14 ENCOUNTER — Ambulatory Visit: Payer: Self-pay | Admitting: Urgent Care

## 2023-11-14 DIAGNOSIS — G40909 Epilepsy, unspecified, not intractable, without status epilepticus: Secondary | ICD-10-CM | POA: Diagnosis not present

## 2023-11-14 DIAGNOSIS — M720 Palmar fascial fibromatosis [Dupuytren]: Secondary | ICD-10-CM | POA: Diagnosis present

## 2023-11-14 DIAGNOSIS — M19041 Primary osteoarthritis, right hand: Secondary | ICD-10-CM | POA: Diagnosis not present

## 2023-11-14 DIAGNOSIS — I10 Essential (primary) hypertension: Secondary | ICD-10-CM | POA: Diagnosis not present

## 2023-11-14 HISTORY — PX: DUPUYTREN CONTRACTURE RELEASE: SHX1478

## 2023-11-14 SURGERY — RELEASE, DUPUYTREN CONTRACTURE
Anesthesia: General | Site: Finger | Laterality: Right

## 2023-11-14 MED ORDER — LIDOCAINE HCL (PF) 2 % IJ SOLN
INTRAMUSCULAR | Status: AC
  Filled 2023-11-14: qty 5

## 2023-11-14 MED ORDER — PROPOFOL 1000 MG/100ML IV EMUL
INTRAVENOUS | Status: AC
  Filled 2023-11-14: qty 100

## 2023-11-14 MED ORDER — KETOROLAC TROMETHAMINE 30 MG/ML IJ SOLN
INTRAMUSCULAR | Status: AC
  Filled 2023-11-14: qty 1

## 2023-11-14 MED ORDER — PHENYLEPHRINE HCL-NACL 20-0.9 MG/250ML-% IV SOLN
INTRAVENOUS | Status: DC | PRN
Start: 2023-11-14 — End: 2023-11-14

## 2023-11-14 MED ORDER — SODIUM CHLORIDE 0.9 % IV SOLN
INTRAVENOUS | Status: DC
Start: 2023-11-14 — End: 2023-11-14

## 2023-11-14 MED ORDER — DEXAMETHASONE SODIUM PHOSPHATE 10 MG/ML IJ SOLN
INTRAMUSCULAR | Status: AC
  Filled 2023-11-14: qty 1

## 2023-11-14 MED ORDER — FENTANYL CITRATE (PF) 100 MCG/2ML IJ SOLN
INTRAMUSCULAR | Status: AC
  Filled 2023-11-14: qty 2

## 2023-11-14 MED ORDER — 0.9 % SODIUM CHLORIDE (POUR BTL) OPTIME
TOPICAL | Status: DC | PRN
  Administered 2023-11-14: 500 mL

## 2023-11-14 MED ORDER — KETOROLAC TROMETHAMINE 30 MG/ML IJ SOLN
INTRAMUSCULAR | Status: DC | PRN
  Administered 2023-11-14: 15 mg via INTRAVENOUS

## 2023-11-14 MED ORDER — HYDROCODONE-ACETAMINOPHEN 5-325 MG PO TABS
1.0000 | ORAL_TABLET | ORAL | Status: DC | PRN
  Administered 2023-11-14: 1 via ORAL

## 2023-11-14 MED ORDER — DEXAMETHASONE SODIUM PHOSPHATE 10 MG/ML IJ SOLN
INTRAMUSCULAR | Status: DC | PRN
  Administered 2023-11-14: 10 mg via INTRAVENOUS

## 2023-11-14 MED ORDER — CEFAZOLIN SODIUM-DEXTROSE 2-4 GM/100ML-% IV SOLN
INTRAVENOUS | Status: AC
  Filled 2023-11-14: qty 100

## 2023-11-14 MED ORDER — LIDOCAINE HCL (CARDIAC) PF 100 MG/5ML IV SOSY
PREFILLED_SYRINGE | INTRAVENOUS | Status: DC | PRN
  Administered 2023-11-14: 100 mg via INTRAVENOUS

## 2023-11-14 MED ORDER — ONDANSETRON HCL 4 MG/2ML IJ SOLN: 4.0000 mg | Freq: Four times a day (QID) | INTRAMUSCULAR | Status: DC | PRN

## 2023-11-14 MED ORDER — PROPOFOL 10 MG/ML IV BOLUS
INTRAVENOUS | Status: DC | PRN
  Administered 2023-11-14: 150 ug/kg/min via INTRAVENOUS
  Administered 2023-11-14: 100 mg via INTRAVENOUS

## 2023-11-14 MED ORDER — METOCLOPRAMIDE HCL 10 MG PO TABS: 5.0000 mg | ORAL_TABLET | Freq: Three times a day (TID) | ORAL | Status: DC | PRN

## 2023-11-14 MED ORDER — KETOROLAC TROMETHAMINE 15 MG/ML IJ SOLN
INTRAMUSCULAR | Status: AC
  Filled 2023-11-14: qty 1

## 2023-11-14 MED ORDER — FENTANYL CITRATE (PF) 100 MCG/2ML IJ SOLN
INTRAMUSCULAR | Status: DC | PRN
  Administered 2023-11-14 (×2): 50 ug via INTRAVENOUS

## 2023-11-14 MED ORDER — KETOROLAC TROMETHAMINE 15 MG/ML IJ SOLN
15.0000 mg | Freq: Once | INTRAMUSCULAR | Status: AC
  Administered 2023-11-14: 15 mg via INTRAVENOUS

## 2023-11-14 MED ORDER — MIDAZOLAM HCL 2 MG/2ML IJ SOLN
INTRAMUSCULAR | Status: DC | PRN
  Administered 2023-11-14: 2 mg via INTRAVENOUS

## 2023-11-14 MED ORDER — BUPIVACAINE HCL (PF) 0.5 % IJ SOLN
INTRAMUSCULAR | Status: DC | PRN
  Administered 2023-11-14: 15 mL

## 2023-11-14 MED ORDER — BUPIVACAINE HCL (PF) 0.5 % IJ SOLN
INTRAMUSCULAR | Status: AC
  Filled 2023-11-14: qty 30

## 2023-11-14 MED ORDER — HYDROCODONE-ACETAMINOPHEN 5-325 MG PO TABS
ORAL_TABLET | ORAL | Status: AC
  Filled 2023-11-14: qty 1

## 2023-11-14 MED ORDER — ACETAMINOPHEN 325 MG PO TABS: 325.0000 mg | ORAL_TABLET | Freq: Four times a day (QID) | ORAL | Status: DC | PRN

## 2023-11-14 MED ORDER — MIDAZOLAM HCL 2 MG/2ML IJ SOLN
INTRAMUSCULAR | Status: AC
  Filled 2023-11-14: qty 2

## 2023-11-14 MED ORDER — HYDROCODONE-ACETAMINOPHEN 5-325 MG PO TABS: 1.0000 | ORAL_TABLET | Freq: Four times a day (QID) | ORAL | 0 refills | Status: AC | PRN

## 2023-11-14 MED ORDER — ONDANSETRON HCL 4 MG PO TABS
4.0000 mg | ORAL_TABLET | Freq: Four times a day (QID) | ORAL | Status: DC | PRN
Start: 2023-11-14 — End: 2023-11-14

## 2023-11-14 MED ORDER — CHLORHEXIDINE GLUCONATE 0.12 % MT SOLN
OROMUCOSAL | Status: AC
  Filled 2023-11-14: qty 15

## 2023-11-14 MED ORDER — METOCLOPRAMIDE HCL 5 MG/ML IJ SOLN: 5.0000 mg | Freq: Three times a day (TID) | INTRAMUSCULAR | Status: DC | PRN

## 2023-11-14 SURGICAL SUPPLY — 31 items
BNDG COHESIVE 4X5 TAN STRL LF (GAUZE/BANDAGES/DRESSINGS) ×1 IMPLANT
BNDG ELASTIC 2INX 5YD STR LF (GAUZE/BANDAGES/DRESSINGS) ×1 IMPLANT
BNDG ELASTIC 3INX 5YD STR LF (GAUZE/BANDAGES/DRESSINGS) ×1 IMPLANT
BNDG ESMARCH 4X12 STRL LF (GAUZE/BANDAGES/DRESSINGS) ×1 IMPLANT
CHLORAPREP W/TINT 26 (MISCELLANEOUS) ×1 IMPLANT
CORD BIP STRL DISP 12FT (MISCELLANEOUS) ×1 IMPLANT
CUFF TOURN SGL QUICK 18X4 (TOURNIQUET CUFF) ×1 IMPLANT
DRAPE SURG 17X11 SM STRL (DRAPES) ×1 IMPLANT
ELECTRODE REM PT RTRN 9FT ADLT (ELECTROSURGICAL) ×1 IMPLANT
FORCEPS JEWEL BIP 4-3/4 STR (INSTRUMENTS) ×1 IMPLANT
GAUZE SPONGE 2X2 STRL 8-PLY (GAUZE/BANDAGES/DRESSINGS) ×1 IMPLANT
GAUZE SPONGE 4X4 12PLY STRL (GAUZE/BANDAGES/DRESSINGS) ×1 IMPLANT
GAUZE STRETCH 2X75IN STRL (MISCELLANEOUS) ×1 IMPLANT
GAUZE XEROFORM 1X8 LF (GAUZE/BANDAGES/DRESSINGS) ×1 IMPLANT
GLOVE BIO SURGEON STRL SZ8 (GLOVE) ×2 IMPLANT
GLOVE INDICATOR 8.0 STRL GRN (GLOVE) ×1 IMPLANT
GOWN STRL REUS W/ TWL LRG LVL3 (GOWN DISPOSABLE) ×1 IMPLANT
GOWN STRL REUS W/ TWL XL LVL3 (GOWN DISPOSABLE) ×1 IMPLANT
KIT TURNOVER KIT A (KITS) ×1 IMPLANT
MANIFOLD NEPTUNE II (INSTRUMENTS) ×1 IMPLANT
NS IRRIG 1000ML POUR BTL (IV SOLUTION) ×1 IMPLANT
NS IRRIG 500ML POUR BTL (IV SOLUTION) ×1 IMPLANT
PACK EXTREMITY ARMC (MISCELLANEOUS) ×1 IMPLANT
PAD PREP OB/GYN DISP 24X41 (PERSONAL CARE ITEMS) ×1 IMPLANT
PENCIL SMOKE EVACUATOR (MISCELLANEOUS) ×1 IMPLANT
SPLINT CAST 1 STEP 4X15 (MISCELLANEOUS) IMPLANT
STOCKINETTE IMPERVIOUS 9X36 MD (GAUZE/BANDAGES/DRESSINGS) ×1 IMPLANT
SUT PROLENE 4 0 PS 2 18 (SUTURE) ×1 IMPLANT
SUT VIC AB 3-0 SH 27X BRD (SUTURE) ×1 IMPLANT
TRAP FLUID SMOKE EVACUATOR (MISCELLANEOUS) ×1 IMPLANT
WATER STERILE IRR 500ML POUR (IV SOLUTION) ×1 IMPLANT
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
## 2023-11-14 NOTE — Discharge Instructions (Addendum)
 Orthopedic discharge instructions: Keep splint dry and intact. Keep hand elevated above heart level. Apply ice to affected area frequently. May resume meloxicam  7.5 mg daily OR take ibuprofen 600-800 mg TID with meals for 3-5 days, then as necessary. Take pain medication as prescribed or ES Tylenol when needed.  Return for follow-up in 10-14 days or as scheduled.     A similar medication of NSAID was given in the OR at 1:06 pm
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
## 2023-11-14 NOTE — Op Note (Signed)
 11/14/2023  1:53 PM  Patient:   Jerome Gilmore  Pre-Op Diagnosis:   Dupuytren's contractures, right ring and little fingers.  Post-Op Diagnosis:   Same.  Procedure:   Release of Dupuytren's contractures, right ring and little fingers.  Surgeon:   Lonnie Roberts, MD  Assistant:   None  Anesthesia:   General LMA  Findings:   As above.  Complications:   None  EBL:   2 cc  Fluids:   300 cc crystalloid  TT:   87 minutes at 250 mmHg  Drains:   None  Closure:   4-0 Prolene interrupted sutures  Brief Clinical Note:   The patient is a 63 year old male with a history of progressively worsening contractures of the little more so than the ring fingers. These symptoms have progressed despite medications, activity modification, etc. The patient's history and examination are consistent with Dupuytren's contractures of the right ring and little fingers. The patient presents at this time for release of the Dupuytren's contractures of the right ring and little fingers.  Procedure:   The patient was brought into the operating room and lain in the supine position. After adequate general laryngeal mask was achieved, the right hand and upper extremity were prepped with ChloraPrep solution before being draped sterilely. Preoperative antibiotics were administered. A timeout to verify the appropriate surgical site.    A Brunner type zigzag incision was made along the volar aspect of the right little finger beginning just proximal to the proximal palmar crease and extending to the PIP flexion crease. The incision was carried down through subcutaneous tissues. The fibrous cord was identified and carefully dissected out from proximal to distal after releasing it proximally. As dissection was carried out, care was taken to identify and protect the common digital nerve and artery on either side of the cord, as well as the underlying flexor tendon, proximally. More distally, the digital neurovascular bundles  were identified and protected. After the mass was removed in its entirety, the adequacy of excision was verified by palpation as well as visually. After excision of the Dupuytren's tissue, the little finger MCP and PIP joints could be extended fully.  A Brunner type zigzag incision was made extending along the volar aspect of the right ring finger beginning at the proximal palmar crease and extending to the MCP flexion crease. The incision was carried down through subcutaneous tissues. The fibrous cord was identified and carefully dissected out from proximal to distal after releasing it proximally. As dissection was carried out, care was taken to identify and protect the common digital nerve and artery on either side of the cord, as well as the underlying flexor tendon, proximally. More distally, the digital neurovascular bundles were identified and protected. After the mass was removed in its entirety, the adequacy of excision was verified by palpation as well as visually. After excision of the Dupuytren's tissue, the ring finger MCP joint could be extended fully.  The wound was copiously irrigated with sterile saline solution before the skin was reapproximated using 4-0 Prolene interrupted sutures. A total of 15 cc of 0.25% plain Sensorcaine was injected in and around the incision to help with postoperative analgesia before a sterile bulky dressing and volar splint extending to the fingertips was applied, maintaining the MCP joints in extension. The patient was then awakened, extubated, and returned to the recovery room in satisfactory condition after tolerating the procedure well.

## 2023-11-14 NOTE — Anesthesia Procedure Notes (Signed)
 Procedure Name: LMA Insertion Date/Time: 11/14/2023 11:48 AM  Performed by: Rahul Malinak R, CRNAPre-anesthesia Checklist: Patient identified, Emergency Drugs available, Suction available, Patient being monitored and Timeout performed Patient Re-evaluated:Patient Re-evaluated prior to induction Oxygen Delivery Method: Circle system utilized Preoxygenation: Pre-oxygenation with 100% oxygen Induction Type: IV induction LMA: LMA inserted LMA Size: 4.0 Number of attempts: 1 Placement Confirmation: positive ETCO2 and breath sounds checked- equal and bilateral Tube secured with: Tape Dental Injury: Teeth and Oropharynx as per pre-operative assessment

## 2023-11-14 NOTE — Transfer of Care (Signed)
 Immediate Anesthesia Transfer of Care Note  Patient: Jerome Gilmore  Procedure(s) Performed: RELEASE, DUPUYTREN CONTRACTURE (Right: Finger)  Patient Location: PACU  Anesthesia Type:General  Level of Consciousness: drowsy  Airway & Oxygen Therapy: Patient Spontanous Breathing and Patient connected to face mask oxygen  Post-op Assessment: Report given to RN, Post -op Vital signs reviewed and stable, and Patient moving all extremities  Post vital signs: Reviewed and stable  Last Vitals:  Vitals Value Taken Time  BP 114/56 11/14/23 1345  Temp    Pulse 53 11/14/23 1348  Resp 12 11/14/23 1348  SpO2 99 % 11/14/23 1348  Vitals shown include unfiled device data.  Last Pain:  Vitals:   11/14/23 0911  TempSrc: Temporal  PainSc: 0-No pain      Patients Stated Pain Goal: 0 (11/14/23 0911)  Complications: There were no known notable events for this encounter.

## 2023-11-15 LAB — SURGICAL PATHOLOGY

## 2023-11-15 NOTE — Anesthesia Postprocedure Evaluation (Signed)
 Anesthesia Post Note  Patient: Dontrey Snellgrove  Procedure(s) Performed: RELEASE, DUPUYTREN CONTRACTURE (Right: Finger)  Patient location during evaluation: PACU Anesthesia Type: General Level of consciousness: awake and alert Pain management: pain level controlled Vital Signs Assessment: post-procedure vital signs reviewed and stable Respiratory status: spontaneous breathing, nonlabored ventilation, respiratory function stable and patient connected to nasal cannula oxygen Cardiovascular status: blood pressure returned to baseline and stable Postop Assessment: no apparent nausea or vomiting Anesthetic complications: no  There were no known notable events for this encounter.   Last Vitals:  Vitals:   11/14/23 1430 11/14/23 1444  BP:  (!) 119/55  Pulse:  (!) 59  Resp:  17  Temp: (!) 36 C (!) 36.1 C  SpO2:  99%    Last Pain:  Vitals:   11/15/23 0950  TempSrc:   PainSc: 3                  Enrique Harvest

## 2023-11-16 ENCOUNTER — Encounter: Payer: Self-pay | Admitting: Surgery
